# Patient Record
Sex: Female | Born: 1950 | Race: White | Hispanic: No | Smoking: Never smoker
Health system: Southern US, Community
[De-identification: ages and names within clinical notes are randomized; demographics above are authoritative.]

## PROBLEM LIST (undated history)

## (undated) DIAGNOSIS — K219 Gastro-esophageal reflux disease without esophagitis: Secondary | ICD-10-CM

## (undated) DIAGNOSIS — N059 Unspecified nephritic syndrome with unspecified morphologic changes: Secondary | ICD-10-CM

## (undated) DIAGNOSIS — M1990 Unspecified osteoarthritis, unspecified site: Secondary | ICD-10-CM

## (undated) DIAGNOSIS — Z8619 Personal history of other infectious and parasitic diseases: Secondary | ICD-10-CM

## (undated) DIAGNOSIS — T7840XA Allergy, unspecified, initial encounter: Secondary | ICD-10-CM

## (undated) DIAGNOSIS — M8580 Other specified disorders of bone density and structure, unspecified site: Secondary | ICD-10-CM

## (undated) DIAGNOSIS — G709 Myoneural disorder, unspecified: Secondary | ICD-10-CM

## (undated) DIAGNOSIS — S3992XA Unspecified injury of lower back, initial encounter: Secondary | ICD-10-CM

## (undated) HISTORY — DX: Other specified disorders of bone density and structure, unspecified site: M85.80

## (undated) HISTORY — DX: Unspecified injury of lower back, initial encounter: S39.92XA

## (undated) HISTORY — PX: TUBAL LIGATION: SHX77

## (undated) HISTORY — DX: Allergy, unspecified, initial encounter: T78.40XA

## (undated) HISTORY — PX: BLADDER AUGMENTATION: SHX1233

## (undated) HISTORY — PX: CATARACT EXTRACTION: SUR2

## (undated) HISTORY — PX: SPINAL FUSION: SHX223

## (undated) HISTORY — PX: TONSILLECTOMY: SUR1361

## (undated) HISTORY — DX: Gastro-esophageal reflux disease without esophagitis: K21.9

## (undated) HISTORY — PX: COLONOSCOPY: SHX174

## (undated) HISTORY — DX: Personal history of other infectious and parasitic diseases: Z86.19

---
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
## 2003-06-26 ENCOUNTER — Encounter: Admission: RE | Admit: 2003-06-26 | Discharge: 2003-06-26 | Payer: Self-pay | Admitting: Neurosurgery

## 2003-07-19 ENCOUNTER — Encounter: Admission: RE | Admit: 2003-07-19 | Discharge: 2003-07-19 | Payer: Self-pay | Admitting: Neurosurgery

## 2003-08-09 ENCOUNTER — Encounter: Admission: RE | Admit: 2003-08-09 | Discharge: 2003-08-09 | Payer: Self-pay | Admitting: Neurosurgery

## 2004-03-22 HISTORY — PX: ABDOMINAL HYSTERECTOMY: SHX81

## 2004-11-02 ENCOUNTER — Inpatient Hospital Stay (HOSPITAL_COMMUNITY): Admission: RE | Admit: 2004-11-02 | Discharge: 2004-11-04 | Payer: Self-pay | Admitting: Obstetrics and Gynecology

## 2008-03-22 HISTORY — PX: CHOLECYSTECTOMY: SHX55

## 2010-12-15 ENCOUNTER — Other Ambulatory Visit: Payer: Self-pay | Admitting: Neurosurgery

## 2010-12-15 DIAGNOSIS — M47816 Spondylosis without myelopathy or radiculopathy, lumbar region: Secondary | ICD-10-CM

## 2010-12-15 DIAGNOSIS — M5126 Other intervertebral disc displacement, lumbar region: Secondary | ICD-10-CM

## 2010-12-18 ENCOUNTER — Ambulatory Visit
Admission: RE | Admit: 2010-12-18 | Discharge: 2010-12-18 | Disposition: A | Discharge status: Home / Self Care | Payer: 59 | Source: Ambulatory Visit | Attending: Neurosurgery | Admitting: Neurosurgery

## 2010-12-18 DIAGNOSIS — M5126 Other intervertebral disc displacement, lumbar region: Secondary | ICD-10-CM

## 2010-12-18 DIAGNOSIS — M47816 Spondylosis without myelopathy or radiculopathy, lumbar region: Secondary | ICD-10-CM

## 2010-12-18 MED ORDER — IOHEXOL 180 MG/ML  SOLN
1.0000 mL | Freq: Once | INTRAMUSCULAR | Status: AC | PRN
  Administered 2010-12-18: 1 mL via EPIDURAL

## 2010-12-18 MED ORDER — METHYLPREDNISOLONE ACETATE 40 MG/ML INJ SUSP (RADIOLOG
120.0000 mg | Freq: Once | INTRAMUSCULAR | Status: AC
  Administered 2010-12-18: 120 mg via EPIDURAL

## 2012-01-26 ENCOUNTER — Other Ambulatory Visit: Payer: Self-pay | Admitting: Neurosurgery

## 2012-01-28 ENCOUNTER — Telehealth: Payer: Self-pay | Admitting: Vascular Surgery

## 2012-01-28 NOTE — Telephone Encounter (Signed)
Message copied by Margaretmary Eddy on Fri Jan 28, 2012 12:45 PM ------      Message from: Phillips Odor      Created: Wed Jan 26, 2012 11:29 AM      Regarding: appt w/ [facility]       This pt. Needs a new pt. Consultation with [facility] prior to ALIF on 03/07/12/ pls. remind pt. To bring a copy of LS spine films/CD ROM to appt. With her.

## 2012-02-22 ENCOUNTER — Encounter: Payer: Self-pay | Admitting: Vascular Surgery

## 2012-02-22 ENCOUNTER — Other Ambulatory Visit: Payer: Self-pay

## 2012-02-23 ENCOUNTER — Encounter: Payer: Self-pay | Admitting: Vascular Surgery

## 2012-02-23 ENCOUNTER — Ambulatory Visit (INDEPENDENT_AMBULATORY_CARE_PROVIDER_SITE_OTHER): Payer: 59 | Admitting: Vascular Surgery

## 2012-02-23 VITALS — BP 121/46 | HR 75 | Resp 16 | Wt 133.0 lb

## 2012-02-23 DIAGNOSIS — IMO0002 Reserved for concepts with insufficient information to code with codable children: Secondary | ICD-10-CM
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
## 2012-02-25 ENCOUNTER — Encounter (HOSPITAL_COMMUNITY): Payer: Self-pay | Admitting: Pharmacy Technician

## 2012-02-28 NOTE — Pre-Procedure Instructions (Signed)
20 Debra Khan  02/28/2012   Your procedure is scheduled on:  03-07-2012  Report to [facility] at 5:30  AM.  Call this number if you have problems the morning of surgery: [phone]   Remember:   Do not eat food or drink:After Midnight.    .  Take these medicines the morning of surgery with A SIP OF WATER: none   Do not wear jewelry, make-up or nail polish.  Do not wear lotions, powders, or perfumes.  Do not shave 48 hours prior to surgery.   Do not bring valuables to the hospital.  Contacts, dentures or bridgework may not be worn into surgery.  Leave suitcase in the car. After surgery it may be brought to your room.   For patients admitted to the hospital, checkout time is 11:00 AM the day of discharge.   Marland Kitchen    Special Instructions: Shower using CHG 2 nights before surgery and the night before surgery.  If you shower the day of surgery use CHG.  Use special wash - you have one bottle of CHG for all showers.  You should use approximately 1/3 of the bottle for each shower.     Please read over the following fact sheets that you were given: Pain Booklet, Coughing and Deep Breathing, Blood Transfusion Information, MRSA Information and Surgical Site Infection Prevention

## 2012-02-29 ENCOUNTER — Encounter (HOSPITAL_COMMUNITY): Payer: Self-pay

## 2012-02-29 ENCOUNTER — Encounter (HOSPITAL_COMMUNITY)
Admission: RE | Admit: 2012-02-29 | Discharge: 2012-02-29 | Disposition: A | Discharge status: Home / Self Care | Payer: 59 | Source: Ambulatory Visit | Attending: Neurosurgery | Admitting: Neurosurgery

## 2012-02-29 DIAGNOSIS — Z01818 Encounter for other preprocedural examination: Secondary | ICD-10-CM | POA: Insufficient documentation

## 2012-02-29 HISTORY — DX: Myoneural disorder, unspecified: G70.9

## 2012-02-29 HISTORY — DX: Unspecified osteoarthritis, unspecified site: M19.90

## 2012-02-29 LAB — BASIC METABOLIC PANEL
BUN: 15 mg/dL (ref 6–23)
CO2: 30 mEq/L (ref 19–32)
Creatinine, Ser: 0.73 mg/dL (ref 0.50–1.10)
GFR calc non Af Amer: >90 mL/min (ref >90)
Glucose, Bld: 107 mg/dL — ABNORMAL HIGH (ref 70–99)
Potassium: 4.1 mEq/L (ref 3.5–5.1)

## 2012-02-29 LAB — SURGICAL PCR SCREEN: Staphylococcus aureus: POSITIVE — AB

## 2012-02-29 LAB — TYPE AND SCREEN
ABO/RH(D): AB NEG
Antibody Screen: NEGATIVE

## 2012-02-29 LAB — CBC
HCT: 40.5 % (ref 36.0–46.0)
Platelets: 269 10*3/uL (ref 150–400)

## 2012-03-07 ENCOUNTER — Encounter (HOSPITAL_COMMUNITY): Admission: RE | Payer: Self-pay | Source: Ambulatory Visit

## 2012-03-07 ENCOUNTER — Inpatient Hospital Stay (HOSPITAL_COMMUNITY): Admission: RE | Admit: 2012-03-07 | Payer: 59 | Source: Ambulatory Visit | Admitting: Neurosurgery

## 2012-03-07 SURGERY — ANTERIOR LUMBAR FUSION 1 LEVEL
Anesthesia: General

## 2012-05-08 ENCOUNTER — Other Ambulatory Visit: Payer: Self-pay | Admitting: Neurosurgery

## 2012-05-08 ENCOUNTER — Telehealth: Payer: Self-pay | Admitting: Vascular Surgery

## 2012-05-08 NOTE — Telephone Encounter (Addendum)
Message copied by Fredrich Birks on Mon May 08, 2012  4:24 PM ------      Message from: Phillips Odor      Created: Mon May 08, 2012 10:35 AM      Regarding: appt for consult       Pt. Needs appt. W/ [facility] prior to surgery for ALIF on 06/16/12; remind pt. To bring copy of LS spine films to appt. with her. ------  05/08/12: lm for pt to call back and confirm so that I may give instructions for appointment, dpm  05/10/12- patient called to schedule, she is aware of date and time, and states that we already have the CD here from her last visit in 02/2012 with [facility]- surgery was postponed until March, dpm

## 2012-05-29 ENCOUNTER — Encounter: Payer: Self-pay | Admitting: Vascular Surgery

## 2012-05-30 ENCOUNTER — Encounter: Payer: Self-pay | Admitting: Vascular Surgery

## 2012-05-30 ENCOUNTER — Ambulatory Visit (INDEPENDENT_AMBULATORY_CARE_PROVIDER_SITE_OTHER): Payer: 59 | Admitting: Vascular Surgery

## 2012-05-30 VITALS — BP 143/69 | HR 84 | Resp 18 | Ht 64.0 in | Wt 137.0 lb

## 2012-05-30 DIAGNOSIS — IMO0002 Reserved for concepts with insufficient information to code with codable children: Secondary | ICD-10-CM
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
## 2012-06-09 ENCOUNTER — Other Ambulatory Visit (HOSPITAL_COMMUNITY): Payer: 59

## 2012-06-14 ENCOUNTER — Other Ambulatory Visit: Payer: Self-pay

## 2012-06-16 ENCOUNTER — Encounter (HOSPITAL_COMMUNITY): Payer: Self-pay | Admitting: Pharmacy Technician

## 2012-06-20 ENCOUNTER — Encounter (HOSPITAL_COMMUNITY)
Admission: RE | Admit: 2012-06-20 | Discharge: 2012-06-20 | Disposition: A | Discharge status: Home / Self Care | Payer: 59 | Source: Ambulatory Visit | Attending: Neurosurgery | Admitting: Neurosurgery

## 2012-06-20 ENCOUNTER — Encounter (HOSPITAL_COMMUNITY): Payer: Self-pay

## 2012-06-20 HISTORY — DX: Unspecified nephritic syndrome with unspecified morphologic changes: N05.9

## 2012-06-20 LAB — CBC
HCT: 37.9 % (ref 36.0–46.0)
Hemoglobin: 13.7 g/dL (ref 12.0–15.0)
MCH: 31.8 pg (ref 26.0–34.0)
MCHC: 36.1 g/dL — ABNORMAL HIGH (ref 30.0–36.0)

## 2012-06-20 LAB — BASIC METABOLIC PANEL
BUN: 13 mg/dL (ref 6–23)
CO2: 30 mEq/L (ref 19–32)
Chloride: 102 mEq/L (ref 96–112)
GFR calc Af Amer: >90 mL/min (ref >90)
Glucose, Bld: 154 mg/dL — ABNORMAL HIGH (ref 70–99)
Potassium: 3.9 mEq/L (ref 3.5–5.1)

## 2012-06-20 LAB — TYPE AND SCREEN: Antibody Screen: NEGATIVE

## 2012-06-20 NOTE — Pre-Procedure Instructions (Signed)
Debra Khan  06/20/2012   Your procedure is scheduled on:  April 10  Report to [facility] at 05:30 AM.  Call this number if you have problems the morning of surgery: [phone]   Remember:   Do not eat food or drink liquids after midnight.   Take these medicines the morning of surgery with A SIP OF WATER: None   STOP Emergen- C after April 3  Do not wear jewelry, make-up or nail polish.  Do not wear lotions, powders, or perfumes. You may wear deodorant.  Do not shave 48 hours prior to surgery. Men may shave face and neck.  Do not bring valuables to the hospital.  Contacts, dentures or bridgework may not be worn into surgery.  Leave suitcase in the car. After surgery it may be brought to your room.  For patients admitted to the hospital, checkout time is 11:00 AM the day of  discharge.   Special Instructions: Shower using CHG 2 nights before surgery and the night before surgery.  If you shower the day of surgery use CHG.  Use special wash - you have one bottle of CHG for all showers.  You should use approximately 1/3 of the bottle for each shower.   Please read over the following fact sheets that you were given: Pain Booklet, Coughing and Deep Breathing, Blood Transfusion Information and Surgical Site Infection Prevention

## 2012-06-28 MED ORDER — CEFAZOLIN SODIUM-DEXTROSE 2-3 GM-% IV SOLR
2.0000 g | INTRAVENOUS | Status: AC
  Administered 2012-06-29: 2 g via INTRAVENOUS
  Filled 2012-06-28: qty 50
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
## 2012-06-29 ENCOUNTER — Inpatient Hospital Stay (HOSPITAL_COMMUNITY)
Admission: RE | Admit: 2012-06-29 | Discharge: 2012-07-01 | DRG: 460 | Disposition: A | Discharge status: Home Health | Payer: 59 | Source: Ambulatory Visit | Attending: Neurosurgery | Admitting: Neurosurgery

## 2012-06-29 ENCOUNTER — Encounter (HOSPITAL_COMMUNITY): Payer: Self-pay | Admitting: Anesthesiology

## 2012-06-29 ENCOUNTER — Encounter (HOSPITAL_COMMUNITY): Payer: Self-pay | Admitting: *Deleted

## 2012-06-29 ENCOUNTER — Ambulatory Visit (HOSPITAL_COMMUNITY): Payer: 59

## 2012-06-29 ENCOUNTER — Encounter (HOSPITAL_COMMUNITY)
Admission: RE | Disposition: A | Discharge status: Home Health | Payer: Self-pay | Source: Ambulatory Visit | Attending: Neurosurgery

## 2012-06-29 ENCOUNTER — Ambulatory Visit (HOSPITAL_COMMUNITY): Payer: 59 | Admitting: Anesthesiology

## 2012-06-29 DIAGNOSIS — M5137 Other intervertebral disc degeneration, lumbosacral region: Secondary | ICD-10-CM | POA: Diagnosis present

## 2012-06-29 DIAGNOSIS — Z01812 Encounter for preprocedural laboratory examination: Secondary | ICD-10-CM

## 2012-06-29 DIAGNOSIS — M47817 Spondylosis without myelopathy or radiculopathy, lumbosacral region: Principal | ICD-10-CM | POA: Diagnosis present

## 2012-06-29 DIAGNOSIS — M549 Dorsalgia, unspecified: Secondary | ICD-10-CM

## 2012-06-29 DIAGNOSIS — M5126 Other intervertebral disc displacement, lumbar region: Secondary | ICD-10-CM | POA: Diagnosis present

## 2012-06-29 DIAGNOSIS — M51379 Other intervertebral disc degeneration, lumbosacral region without mention of lumbar back pain or lower extremity pain: Secondary | ICD-10-CM | POA: Diagnosis present

## 2012-06-29 HISTORY — PX: ANTERIOR LUMBAR FUSION: SHX1170

## 2012-06-29 HISTORY — PX: ABDOMINAL EXPOSURE: SHX5708

## 2012-06-29 LAB — CBC
HCT: 34.8 % — ABNORMAL LOW (ref 36.0–46.0)
Hemoglobin: 12.5 g/dL (ref 12.0–15.0)
MCV: 87.7 fL (ref 78.0–100.0)
RDW: 12.9 % (ref 11.5–15.5)
WBC: 12.6 10*3/uL — ABNORMAL HIGH (ref 4.0–10.5)

## 2012-06-29 LAB — CREATININE, SERUM
GFR calc Af Amer: >90 mL/min (ref >90)
GFR calc non Af Amer: >90 mL/min (ref >90)

## 2012-06-29 SURGERY — ANTERIOR LUMBAR FUSION 1 LEVEL
Anesthesia: General | Site: Spine Lumbar | Wound class: Clean

## 2012-06-29 MED ORDER — ALUM & MAG HYDROXIDE-SIMETH 200-200-20 MG/5ML PO SUSP: 30.0000 mL | Freq: Four times a day (QID) | ORAL | Status: DC | PRN

## 2012-06-29 MED ORDER — DIAZEPAM 5 MG PO TABS
5.0000 mg | ORAL_TABLET | Freq: Four times a day (QID) | ORAL | Status: DC | PRN
  Administered 2012-07-01: 5 mg via ORAL
  Filled 2012-06-29: qty 1

## 2012-06-29 MED ORDER — HYDROMORPHONE HCL PF 1 MG/ML IJ SOLN
INTRAMUSCULAR | Status: AC
  Filled 2012-06-29: qty 1

## 2012-06-29 MED ORDER — KCL IN DEXTROSE-NACL 20-5-0.45 MEQ/L-%-% IV SOLN
INTRAVENOUS | Status: DC
  Administered 2012-06-29 – 2012-06-30 (×2): via INTRAVENOUS
  Filled 2012-06-29 (×5): qty 1000

## 2012-06-29 MED ORDER — HYDROMORPHONE HCL PF 1 MG/ML IJ SOLN
0.2500 mg | INTRAMUSCULAR | Status: DC | PRN
  Administered 2012-06-29 (×2): 0.5 mg via INTRAVENOUS

## 2012-06-29 MED ORDER — HEMOSTATIC AGENTS (NO CHARGE) OPTIME
TOPICAL | Status: DC | PRN
  Administered 2012-06-29: 1 via TOPICAL

## 2012-06-29 MED ORDER — CEFAZOLIN SODIUM 1-5 GM-% IV SOLN
1.0000 g | Freq: Three times a day (TID) | INTRAVENOUS | Status: AC
  Administered 2012-06-29 (×2): 1 g via INTRAVENOUS
  Filled 2012-06-29 (×2): qty 50

## 2012-06-29 MED ORDER — GLYCOPYRROLATE 0.2 MG/ML IJ SOLN
INTRAMUSCULAR | Status: DC | PRN
  Administered 2012-06-29: 0.6 mg via INTRAVENOUS

## 2012-06-29 MED ORDER — ONDANSETRON HCL 4 MG/2ML IJ SOLN: 4.0000 mg | Freq: Four times a day (QID) | INTRAMUSCULAR | Status: DC | PRN

## 2012-06-29 MED ORDER — MORPHINE SULFATE (PF) 1 MG/ML IV SOLN
INTRAVENOUS | Status: DC
  Administered 2012-06-29: 12:00:00 via INTRAVENOUS
  Administered 2012-06-29: 9 mg via INTRAVENOUS
  Administered 2012-06-29: 4.5 mg via INTRAVENOUS
  Administered 2012-06-30: 06:00:00 via INTRAVENOUS
  Administered 2012-06-30: 4.39 mg via INTRAVENOUS
  Filled 2012-06-29: qty 25

## 2012-06-29 MED ORDER — SODIUM CHLORIDE 0.9 % IJ SOLN: 9.0000 mL | INTRAMUSCULAR | Status: DC | PRN

## 2012-06-29 MED ORDER — ONDANSETRON HCL 4 MG/2ML IJ SOLN
4.0000 mg | Freq: Once | INTRAMUSCULAR | Status: AC | PRN
  Administered 2012-06-29: 4 mg via INTRAVENOUS

## 2012-06-29 MED ORDER — ONDANSETRON HCL 4 MG/2ML IJ SOLN
INTRAMUSCULAR | Status: DC | PRN
  Administered 2012-06-29: 4 mg via INTRAVENOUS

## 2012-06-29 MED ORDER — ENOXAPARIN SODIUM 30 MG/0.3ML ~~LOC~~ SOLN
30.0000 mg | SUBCUTANEOUS | Status: DC
  Administered 2012-06-30 – 2012-07-01 (×2): 30 mg via SUBCUTANEOUS
  Filled 2012-06-29 (×3): qty 0.3

## 2012-06-29 MED ORDER — DIPHENHYDRAMINE HCL 50 MG/ML IJ SOLN: 12.5000 mg | Freq: Four times a day (QID) | INTRAMUSCULAR | Status: DC | PRN

## 2012-06-29 MED ORDER — DEXAMETHASONE SODIUM PHOSPHATE 4 MG/ML IJ SOLN
INTRAMUSCULAR | Status: DC | PRN
  Administered 2012-06-29: 4 mg via INTRAVENOUS

## 2012-06-29 MED ORDER — NALOXONE HCL 0.4 MG/ML IJ SOLN: 0.4000 mg | INTRAMUSCULAR | Status: DC | PRN

## 2012-06-29 MED ORDER — ACETAMINOPHEN 650 MG RE SUPP: 650.0000 mg | RECTAL | Status: DC | PRN

## 2012-06-29 MED ORDER — ACETAMINOPHEN 325 MG PO TABS: 650.0000 mg | ORAL_TABLET | ORAL | Status: DC | PRN

## 2012-06-29 MED ORDER — HYDROCODONE-ACETAMINOPHEN 5-325 MG PO TABS
1.0000 | ORAL_TABLET | ORAL | Status: DC | PRN
  Administered 2012-06-30 – 2012-07-01 (×4): 2 via ORAL
  Filled 2012-06-29 (×4): qty 2

## 2012-06-29 MED ORDER — FLEET ENEMA 7-19 GM/118ML RE ENEM: 1.0000 | ENEMA | Freq: Once | RECTAL | Status: AC | PRN

## 2012-06-29 MED ORDER — 0.9 % SODIUM CHLORIDE (POUR BTL) OPTIME
TOPICAL | Status: DC | PRN
  Administered 2012-06-29: 1000 mL

## 2012-06-29 MED ORDER — SODIUM CHLORIDE 0.9 % IJ SOLN: 3.0000 mL | INTRAMUSCULAR | Status: DC | PRN

## 2012-06-29 MED ORDER — DIPHENHYDRAMINE HCL 12.5 MG/5ML PO ELIX: 12.5000 mg | ORAL_SOLUTION | Freq: Four times a day (QID) | ORAL | Status: DC | PRN

## 2012-06-29 MED ORDER — NEOSTIGMINE METHYLSULFATE 1 MG/ML IJ SOLN
INTRAMUSCULAR | Status: DC | PRN
  Administered 2012-06-29: 5 mg via INTRAVENOUS

## 2012-06-29 MED ORDER — PHENOL 1.4 % MT LIQD: 1.0000 | OROMUCOSAL | Status: DC | PRN

## 2012-06-29 MED ORDER — BISACODYL 10 MG RE SUPP: 10.0000 mg | Freq: Every day | RECTAL | Status: DC | PRN

## 2012-06-29 MED ORDER — EPHEDRINE SULFATE 50 MG/ML IJ SOLN
INTRAMUSCULAR | Status: DC | PRN
  Administered 2012-06-29 (×2): 5 mg via INTRAVENOUS

## 2012-06-29 MED ORDER — DOCUSATE SODIUM 100 MG PO CAPS
100.0000 mg | ORAL_CAPSULE | Freq: Two times a day (BID) | ORAL | Status: DC
  Administered 2012-06-29 – 2012-06-30 (×4): 100 mg via ORAL
  Filled 2012-06-29 (×4): qty 1

## 2012-06-29 MED ORDER — MIDAZOLAM HCL 5 MG/5ML IJ SOLN
INTRAMUSCULAR | Status: DC | PRN
  Administered 2012-06-29: 2 mg via INTRAVENOUS

## 2012-06-29 MED ORDER — LACTATED RINGERS IV SOLN
INTRAVENOUS | Status: DC | PRN
  Administered 2012-06-29 (×3): via INTRAVENOUS

## 2012-06-29 MED ORDER — LIDOCAINE HCL 4 % MT SOLN
OROMUCOSAL | Status: DC | PRN
  Administered 2012-06-29: 4 mL via TOPICAL

## 2012-06-29 MED ORDER — SENNA 8.6 MG PO TABS
1.0000 | ORAL_TABLET | Freq: Two times a day (BID) | ORAL | Status: DC
  Administered 2012-06-30 (×2): 8.6 mg via ORAL
  Filled 2012-06-29 (×4): qty 1

## 2012-06-29 MED ORDER — THROMBIN 5000 UNITS EX SOLR
CUTANEOUS | Status: DC | PRN
  Administered 2012-06-29 (×2): 5000 [IU] via TOPICAL

## 2012-06-29 MED ORDER — ONDANSETRON HCL 4 MG/2ML IJ SOLN: 4.0000 mg | INTRAMUSCULAR | Status: DC | PRN

## 2012-06-29 MED ORDER — OXYCODONE-ACETAMINOPHEN 5-325 MG PO TABS: 1.0000 | ORAL_TABLET | ORAL | Status: DC | PRN

## 2012-06-29 MED ORDER — ROCURONIUM BROMIDE 100 MG/10ML IV SOLN
INTRAVENOUS | Status: DC | PRN
  Administered 2012-06-29: 50 mg via INTRAVENOUS

## 2012-06-29 MED ORDER — LIDOCAINE HCL (CARDIAC) 20 MG/ML IV SOLN
INTRAVENOUS | Status: DC | PRN
  Administered 2012-06-29: 60 mg via INTRAVENOUS

## 2012-06-29 MED ORDER — MENTHOL 3 MG MT LOZG: 1.0000 | LOZENGE | OROMUCOSAL | Status: DC | PRN

## 2012-06-29 MED ORDER — MORPHINE SULFATE (PF) 1 MG/ML IV SOLN
INTRAVENOUS | Status: AC
  Filled 2012-06-29: qty 25

## 2012-06-29 MED ORDER — PANTOPRAZOLE SODIUM 40 MG IV SOLR
40.0000 mg | Freq: Every day | INTRAVENOUS | Status: DC
  Administered 2012-06-29: 40 mg via INTRAVENOUS
  Filled 2012-06-29 (×2): qty 40

## 2012-06-29 MED ORDER — SUFENTANIL CITRATE 50 MCG/ML IV SOLN
INTRAVENOUS | Status: DC | PRN
  Administered 2012-06-29: 20 ug via INTRAVENOUS
  Administered 2012-06-29: 10 ug via INTRAVENOUS

## 2012-06-29 MED ORDER — PROPOFOL 10 MG/ML IV BOLUS
INTRAVENOUS | Status: DC | PRN
  Administered 2012-06-29: 200 mg via INTRAVENOUS

## 2012-06-29 MED ORDER — ONDANSETRON HCL 4 MG/2ML IJ SOLN
INTRAMUSCULAR | Status: AC
  Filled 2012-06-29: qty 2

## 2012-06-29 MED ORDER — LACTATED RINGERS IV SOLN
INTRAVENOUS | Status: DC | PRN
  Administered 2012-06-29: 08:00:00 via INTRAVENOUS

## 2012-06-29 MED ORDER — SODIUM CHLORIDE 0.9 % IV SOLN
250.0000 mL | INTRAVENOUS | Status: DC
Start: 2012-06-29 — End: 2012-07-01

## 2012-06-29 MED ORDER — VECURONIUM BROMIDE 10 MG IV SOLR
INTRAVENOUS | Status: DC | PRN
  Administered 2012-06-29: 2 mg via INTRAVENOUS
  Administered 2012-06-29: 3 mg via INTRAVENOUS
  Administered 2012-06-29: 2 mg via INTRAVENOUS
  Administered 2012-06-29: 3 mg via INTRAVENOUS

## 2012-06-29 MED ORDER — SODIUM CHLORIDE 0.9 % IJ SOLN
3.0000 mL | Freq: Two times a day (BID) | INTRAMUSCULAR | Status: DC
  Administered 2012-06-29 – 2012-06-30 (×4): 3 mL via INTRAVENOUS

## 2012-06-29 MED ORDER — SENNOSIDES-DOCUSATE SODIUM 8.6-50 MG PO TABS
1.0000 | ORAL_TABLET | Freq: Every evening | ORAL | Status: DC | PRN
  Administered 2012-06-29: 1 via ORAL
  Filled 2012-06-29: qty 1

## 2012-06-29 SURGICAL SUPPLY — 101 items
ADH SKN CLS APL DERMABOND .7 (GAUZE/BANDAGES/DRESSINGS)
ADH SKN CLS LQ APL DERMABOND (GAUZE/BANDAGES/DRESSINGS) ×2
APPLIER CLIP 11 MED OPEN (CLIP) ×3
APR CLP MED 11 20 MLT OPN (CLIP) ×2
BONE VOID FILLER STRIP 10CC (Bone Implant) ×1 IMPLANT
BUR BARREL STRAIGHT FLUTE 4.0 (BURR) IMPLANT
CAGE ALIF MED 12X12MM LUMBAR (Cage) ×1 IMPLANT
CANISTER SUCTION 2500CC (MISCELLANEOUS) ×3 IMPLANT
CLIP APPLIE 11 MED OPEN (CLIP) ×4 IMPLANT
CLOTH BEACON ORANGE TIMEOUT ST (SAFETY) ×5 IMPLANT
CONT SPEC 4OZ CLIKSEAL STRL BL (MISCELLANEOUS) ×3 IMPLANT
COVER BACK TABLE 24X17X13 BIG (DRAPES) IMPLANT
COVER TABLE BACK 60X90 (DRAPES) ×3 IMPLANT
DERMABOND ADHESIVE PROPEN (GAUZE/BANDAGES/DRESSINGS) ×1
DERMABOND ADVANCED (GAUZE/BANDAGES/DRESSINGS)
DERMABOND ADVANCED .7 DNX12 (GAUZE/BANDAGES/DRESSINGS) ×4 IMPLANT
DERMABOND ADVANCED .7 DNX6 (GAUZE/BANDAGES/DRESSINGS) IMPLANT
DRAPE C-ARM 42X72 X-RAY (DRAPES) ×6 IMPLANT
DRAPE INCISE IOBAN 66X45 STRL (DRAPES) ×2 IMPLANT
DRAPE LAPAROTOMY 100X72X124 (DRAPES) ×3 IMPLANT
DRAPE POUCH INSTRU U-SHP 10X18 (DRAPES) ×3 IMPLANT
DRESSING TELFA 8X3 (GAUZE/BANDAGES/DRESSINGS) IMPLANT
DURAPREP 26ML APPLICATOR (WOUND CARE) ×2 IMPLANT
ELECT BLADE 4.0 EZ CLEAN MEGAD (MISCELLANEOUS) ×3
ELECT REM PT RETURN 9FT ADLT (ELECTROSURGICAL) ×3
ELECTRODE BLDE 4.0 EZ CLN MEGD (MISCELLANEOUS) ×2 IMPLANT
ELECTRODE REM PT RTRN 9FT ADLT (ELECTROSURGICAL) ×2 IMPLANT
FELT TEFLON 6X6 (MISCELLANEOUS) IMPLANT
GAUZE SPONGE 4X4 16PLY XRAY LF (GAUZE/BANDAGES/DRESSINGS) IMPLANT
GLOVE BIO SURGEON STRL SZ8 (GLOVE) ×3 IMPLANT
GLOVE BIOGEL M 8.0 STRL (GLOVE) ×1 IMPLANT
GLOVE BIOGEL PI IND STRL 7.5 (GLOVE) ×2 IMPLANT
GLOVE BIOGEL PI IND STRL 8 (GLOVE) IMPLANT
GLOVE BIOGEL PI IND STRL 8.5 (GLOVE) ×2 IMPLANT
GLOVE BIOGEL PI INDICATOR 7.5 (GLOVE) ×1
GLOVE BIOGEL PI INDICATOR 8 (GLOVE) ×3
GLOVE BIOGEL PI INDICATOR 8.5 (GLOVE) ×3
GLOVE ECLIPSE 7.5 STRL STRAW (GLOVE) ×6 IMPLANT
GLOVE EXAM NITRILE LRG STRL (GLOVE) IMPLANT
GLOVE EXAM NITRILE MD LF STRL (GLOVE) ×1 IMPLANT
GLOVE EXAM NITRILE XL STR (GLOVE) IMPLANT
GLOVE EXAM NITRILE XS STR PU (GLOVE) IMPLANT
GOWN BRE IMP SLV AUR LG STRL (GOWN DISPOSABLE) ×2 IMPLANT
GOWN BRE IMP SLV AUR XL STRL (GOWN DISPOSABLE) ×1 IMPLANT
GOWN STRL NON-REIN LRG LVL3 (GOWN DISPOSABLE) ×2 IMPLANT
GOWN STRL REIN 2XL LVL4 (GOWN DISPOSABLE) ×3 IMPLANT
INSERT FOGARTY 61MM (MISCELLANEOUS) IMPLANT
INSERT FOGARTY SM (MISCELLANEOUS) IMPLANT
KIT BASIN OR (CUSTOM PROCEDURE TRAY) ×3 IMPLANT
KIT INFUSE SMALL (Orthopedic Implant) ×1 IMPLANT
KIT ROOM TURNOVER OR (KITS) ×5 IMPLANT
LOOP VESSEL MAXI BLUE (MISCELLANEOUS) ×2 IMPLANT
LOOP VESSEL MINI RED (MISCELLANEOUS) ×2 IMPLANT
NDL HYPO 25X1 1.5 SAFETY (NEEDLE) IMPLANT
NDL SPNL 18GX3.5 QUINCKE PK (NEEDLE) ×2 IMPLANT
NEEDLE BONE MARROW 8GAX6 (NEEDLE) ×1 IMPLANT
NEEDLE HYPO 25X1 1.5 SAFETY (NEEDLE) IMPLANT
NEEDLE SPNL 18GX3.5 QUINCKE PK (NEEDLE) ×3 IMPLANT
NS IRRIG 1000ML POUR BTL (IV SOLUTION) ×3 IMPLANT
PACK LAMINECTOMY NEURO (CUSTOM PROCEDURE TRAY) ×3 IMPLANT
PAD ARMBOARD 7.5X6 YLW CONV (MISCELLANEOUS) ×7 IMPLANT
PLATE ALIF SPINAL 10MM (Plate) ×1 IMPLANT
SCREW 25MM (Screw) ×6 IMPLANT
SCREW 30MM (Screw) ×2 IMPLANT
SCREW BN 25X6XNS SPNE ALIF (Screw) IMPLANT
SPONGE GAUZE 4X4 12PLY (GAUZE/BANDAGES/DRESSINGS) IMPLANT
SPONGE INTESTINAL PEANUT (DISPOSABLE) ×13 IMPLANT
SPONGE LAP 18X18 X RAY DECT (DISPOSABLE) ×3 IMPLANT
SPONGE LAP 4X18 X RAY DECT (DISPOSABLE) IMPLANT
SPONGE SURGIFOAM ABS GEL SZ50 (HEMOSTASIS) ×1 IMPLANT
STAPLER VISISTAT 35W (STAPLE) ×1 IMPLANT
SUT PROLENE 4 0 RB 1 (SUTURE)
SUT PROLENE 4-0 RB1 .5 CRCL 36 (SUTURE) ×8 IMPLANT
SUT PROLENE 5 0 CC1 (SUTURE) IMPLANT
SUT PROLENE 6 0 C 1 30 (SUTURE) ×2 IMPLANT
SUT PROLENE 6 0 CC (SUTURE) IMPLANT
SUT SILK 0 TIES 10X30 (SUTURE) ×2 IMPLANT
SUT SILK 2 0 TIES 10X30 (SUTURE) ×2 IMPLANT
SUT SILK 2 0 TIES 17X18 (SUTURE) ×3
SUT SILK 2 0SH CR/8 30 (SUTURE) IMPLANT
SUT SILK 2-0 18XBRD TIE BLK (SUTURE) ×2 IMPLANT
SUT SILK 3 0 TIES 10X30 (SUTURE) ×3 IMPLANT
SUT SILK 3 0SH CR/8 30 (SUTURE) IMPLANT
SUT VIC AB 0 CT1 27 (SUTURE) ×3
SUT VIC AB 0 CT1 27XBRD ANBCTR (SUTURE) ×6 IMPLANT
SUT VIC AB 1 CT1 18XBRD ANBCTR (SUTURE) ×2 IMPLANT
SUT VIC AB 1 CT1 8-18 (SUTURE)
SUT VIC AB 2-0 CT1 18 (SUTURE) ×3 IMPLANT
SUT VIC AB 2-0 CT1 27 (SUTURE)
SUT VIC AB 2-0 CT1 27XBRD (SUTURE) ×2 IMPLANT
SUT VIC AB 2-0 CTB1 (SUTURE) ×2 IMPLANT
SUT VIC AB 3-0 SH 27 (SUTURE)
SUT VIC AB 3-0 SH 27X BRD (SUTURE) ×2 IMPLANT
SUT VIC AB 3-0 SH 8-18 (SUTURE) ×4 IMPLANT
SUT VICRYL 4-0 PS2 18IN ABS (SUTURE) IMPLANT
SYR 20ML ECCENTRIC (SYRINGE) ×3 IMPLANT
TOWEL OR 17X24 6PK STRL BLUE (TOWEL DISPOSABLE) ×5 IMPLANT
TOWEL OR 17X26 10 PK STRL BLUE (TOWEL DISPOSABLE) ×5 IMPLANT
TRAP SPECIMEN MUCOUS 40CC (MISCELLANEOUS) ×3 IMPLANT
TRAY FOLEY CATH 14FRSI W/METER (CATHETERS) ×3 IMPLANT
WATER STERILE IRR 1000ML POUR (IV SOLUTION) ×3 IMPLANT

## 2012-06-29 NOTE — Anesthesia Postprocedure Evaluation (Signed)
  Anesthesia Post-op Note  Patient: Debra Khan  Procedure(s) Performed: Procedure(s) with comments: ANTERIOR LUMBAR FUSION 1 LEVEL (N/A) - Lumbar five-Sacral one Anterior lumbar interbody fusion with Dr. Edilia Bo for approach ABDOMINAL EXPOSURE (N/A)  Patient Location: PACU  Anesthesia Type:General  Level of Consciousness: awake, oriented and patient cooperative  Airway and Oxygen Therapy: Patient Spontanous Breathing  Post-op Pain: mild  Post-op Assessment: Post-op Vital signs reviewed, Patient's Cardiovascular Status Stable, Respiratory Function Stable, Patent Airway, No signs of Nausea or vomiting and Pain level controlled  Post-op Vital Signs: stable  Complications: No apparent anesthesia complications

## 2012-06-29 NOTE — Anesthesia Preprocedure Evaluation (Addendum)
Anesthesia Evaluation  Patient identified by MRN, date of birth, ID band Patient awake    Reviewed: Allergy & Precautions, H&P , NPO status , Patient's Chart, lab work & pertinent test results  History of Anesthesia Complications Negative for: history of anesthetic complications  Airway Mallampati: II TM Distance: >3 FB Neck ROM: full    Dental  (+) Teeth Intact and Dental Advisory Given   Pulmonary neg pulmonary ROS,          Cardiovascular negative cardio ROS  Rhythm:regular Rate:Normal     Neuro/Psych  Neuromuscular disease    GI/Hepatic negative GI ROS, Neg liver ROS,   Endo/Other  negative endocrine ROS  Renal/GU 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     Musculoskeletal   Abdominal   Peds  Hematology negative hematology ROS (+)   Anesthesia Other Findings   Reproductive/Obstetrics negative OB ROS                          Anesthesia Physical Anesthesia Plan  ASA: I  Anesthesia Plan: General   Post-op Pain Management:    Induction: Intravenous  Airway Management Planned: Oral ETT  Additional Equipment:   Intra-op Plan:   Post-operative Plan: Extubation in OR  Informed Consent: I have reviewed the patients History and Physical, chart, labs and discussed the procedure including the risks, benefits and alternatives for the proposed anesthesia with the patient or authorized representative who has indicated his/her understanding and acceptance.     Plan Discussed with: CRNA, Anesthesiologist and Surgeon  Anesthesia Plan Comments:         Anesthesia Quick Evaluation

## 2012-06-29 NOTE — Brief Op Note (Signed)
06/29/2012  11:06 AM  PATIENT:  Debra Khan  62 y.o. 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 Dickson for approach ABDOMINAL EXPOSURE (N/A) With anterior lumbar plate SURGEON:  Surgeon(s) and Role: Panel 1:    * Naziyah Tieszen, MD - Primary    * Ernesto M Botero, MD  Panel 2:    * Christopher S Dickson, MD - Primary  PHYSICIAN ASSISTANT:   ASSISTANTS: Poteat, RN   ANESTHESIA:   general  EBL:  Total I/O In: 2250 [I.V.:2250] Out: 250 [Urine:200; Blood:50]  BLOOD ADMINISTERED:none  DRAINS: none   LOCAL MEDICATIONS USED:  MARCAINE     SPECIMEN:  No Specimen  DISPOSITION OF SPECIMEN:  N/A  COUNTS:  YES  TOURNIQUET:  * No tourniquets in log *  DICTATION: INDICATIONS:  Pateint is 62 year old female with chronic and intractable back and bilateral lower extremity pain with marked disc degeneration and spondylosis at the L 5 S 1 level.  She has failed to improve despite extensive and prolonged conservative therapy.  It was elected to take her to surgery for anterior lumbar decompression and fusion at the L 5 S1 level.  PROCEDURE:  Doctor Dickson performed exposure and his portion of the procedure will be dictated separately.  Upon exposing the L 5 S1 level, a localizing X ray was obtained with the C arm.  I then incised the anterior annulus and performed a thorough discectomy.  The endplates were cleared of disc and cartilagenous material and a thorough discectomy was performed with decompression of the ventral annulus and disc material.  After trial, a 12 degree  lordotic Solus Alphatec PEEK spacer with titanium finswas selected, packed with small BMP and NexOss which was reconstituted with marrow rich blood aspirated from the S 1 vertebral body.  The implant was tamped into position and positioning was confirmed with C arm. The fins were deployed without difficulty.  A 10 mm lumbosacral plate was then affixed to the L 5 and S1 vertebrae using 25 and 30 x 6.0 mm screws.  Locking mechanisms were engaged, soft tissues were inspected and found to be in good repair.  Retractors were removed.  Fascia was closed with 0 vicryl running stitch, skin edges closed with 2-0 and 3-0 vicryl sutures.  Wound was dressed with a sterile occlusive dressing.  Patient was extubated in the OR and taken to recovery having tolerated her surgery well.  Counts were correct. Final AP radiograph showed no retained foreign bodies.   PLAN OF CARE: Admit to inpatient   PATIENT DISPOSITION:  PACU - hemodynamically stable.   Delay start of Pharmacological VTE agent (>24hrs) due to surgical blood loss or risk of bleeding: yes  

## 2012-06-29 NOTE — Clinical Social Work Note (Signed)
Clinical Social Work   CSW received consult for SNF. CSW reviewed chart. Per chart review, pt had surgery today. Awaiting PT/OT evals for discharge recommendations. PT/OT evals are needed for prior auth for SNF placement. CSW will assess pt for SNF, if appropriate. CSW will continue to follow.   Dede Query, MSW, LCSW [phone]

## 2012-06-29 NOTE — Anesthesia Procedure Notes (Signed)
Procedure Name: Intubation Date/Time: 06/29/2012 7:36 AM Performed by: Lovie Chol Pre-anesthesia Checklist: Patient identified, Emergency Drugs available, Suction available, Patient being monitored and Timeout performed Patient Re-evaluated:Patient Re-evaluated prior to inductionOxygen Delivery Method: Circle system utilized Preoxygenation: Pre-oxygenation with 100% oxygen Intubation Type: IV induction Ventilation: Mask ventilation without difficulty Laryngoscope Size: Miller and 2 Grade View: Grade I Tube type: Oral Tube size: 7.5 mm Number of attempts: 1 Airway Equipment and Method: Bougie stylet and LTA kit utilized Placement Confirmation: ETT inserted through vocal cords under direct vision,  positive ETCO2,  CO2 detector and breath sounds checked- equal and bilateral Secured at: 21 cm Tube secured with: Tape Dental Injury: Teeth and Oropharynx as per pre-operative assessment  Comments: Smooth IV induction. EZ mask. DL x 1 Mil 2. Grade 2 view with cricoid. LTA easily passed. Unable to pass ETT through cords. Bougie stylet utilized. ETT slid easily over Bougie. +ETCO2. bbse.

## 2012-06-29 NOTE — Op Note (Signed)
NAME: Debra Khan   MRN: [id number] DOB: 06-Oct-1950    DATE OF OPERATION: 06/29/2012  PREOP DIAGNOSIS: degenerative disc disease L5-S1  POSTOP DIAGNOSIS: same  PROCEDURE: anterior retroperitoneal exposure L5-S1  SURGEON: Di Kindle. Edilia Bo, MD, FACS  ASSIST: Frederik Pear Nurse practitioner  ANESTHESIA: Gen.   EBL: minimal  INDICATIONS: [name] is a 62 y.o. female with degenerative disc disease at L5-S1. We were asked to provide anterior retroperitoneal exposure  FINDINGS: left iliac arteries were soft without significant plaque.  TECHNIQUE: Patient was taken to the operating room and received a general anesthetic. The level of the L5-S1 disc was marked under fluoroscopy. The abdomen was prepped and draped in usual sterile fashion. A transverse incision was made to the left of the midline at the level that had been marked. Skin incision was carried down to the anterior rectus fascia. This was divided transversely. The incision in the fascia on the right extended past the linea alba to the right rectus abdominis muscle. The anterior rectus sheath was mobilized superiorly and inferiorly allowing the left rectus abdominis muscle to be fully mobilized. Initially, this muscle was retracted medially. This allowed entry into the retroperitoneal space. The retroperitoneal dissection was continued medial to the left common and external iliac arteries down to the sacral promontory. The middle sacral vessels were identified dissected free and then doubly clipped and divided. Oozing blunt dissection the left common iliac vein was dissected free from the L5-S1 disc. Once the disc was adequately exposed the [facility] retractor was placed. The reversed with retractor was placed on the right side of the disc and then also on the left side the disc allowing adequate exposure of L5-S1 disc. The level of the exposure was confirmed under fluoroscopy.  The remainder of the dictation is as per Dr. Kerri Perches.  Waverly Ferrari, MD, FACS [facility]  DATE OF DICTATION:   06/29/2012

## 2012-06-29 NOTE — Interval H&P Note (Signed)
History and Physical Interval Note:  06/29/2012 7:26 AM  Debra Khan  has presented today for surgery, with the diagnosis of Lumbar spondylosis, Lumbar degenerative disc disease, Lumbar radiculopathy, Lumbar hnp without myelopathy  The various methods of treatment have been discussed with the patient and family. After consideration of risks, benefits and other options for treatment, the patient has consented to  Procedure(s) with comments: ANTERIOR LUMBAR FUSION 1 LEVEL (N/A) - L5-S1 Anterior lumbar interbody fusion with Dr. Arbie Cookey for approach ABDOMINAL EXPOSURE (N/A) as a surgical intervention .  [name] history has been reviewed, patient examined, no change in status, stable for surgery.  I have reviewed the patient's chart and labs.  Questions were answered to the patient's satisfaction.     Verbena Boeding D
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
## 2012-06-29 NOTE — Progress Notes (Signed)
Awake, alert, conversant.  Full strength both legs.  No numbness.  Doing well.   

## 2012-06-29 NOTE — Progress Notes (Signed)
Pt today admission received from PACU  S/P L5 -S1  AL IF. Pt wake a  Awake ,alert and oriented x4 moves all  extremeties  Slight numbness and tingling to rt  Toes.  PAC morphine in use pain under control per pt . assessment done. Incision site dry clean and intact. Pt made comfortable in bed needed care rendered oriented to room  call bell within reach to call for assist. Rn will continue to monitor Debra Roup Rn .

## 2012-06-29 NOTE — Preoperative (Signed)
Beta Blockers   Reason not to administer Beta Blockers:Not Applicable 

## 2012-06-29 NOTE — Op Note (Signed)
06/29/2012  11:06 AM  PATIENT:  Debra Khan  62 y.o. 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 Edilia Bo for approach ABDOMINAL EXPOSURE (N/A) With anterior lumbar plate SURGEON:  Surgeon(s) and Role: Panel 1:    * Maeola Harman, MD - Primary    * Karn Cassis, MD  Panel 2:    * Chuck Hint, MD - Primary  PHYSICIAN ASSISTANT:   ASSISTANTS: Poteat, RN   ANESTHESIA:   general  EBL:  Total I/O In: 2250 [I.V.:2250] Out: 250 [Urine:200; Blood:50]  BLOOD ADMINISTERED:none  DRAINS: none   LOCAL MEDICATIONS USED:  MARCAINE     SPECIMEN:  No Specimen  DISPOSITION OF SPECIMEN:  N/A  COUNTS:  YES  TOURNIQUET:  * No tourniquets in log *  DICTATION: INDICATIONS:  Pateint is 62 year old female with chronic and intractable back and bilateral lower extremity pain with marked disc degeneration and spondylosis at the L 5 S 1 level.  She has failed to improve despite extensive and prolonged conservative therapy.  It was elected to take her to surgery for anterior lumbar decompression and fusion at the L 5 S1 level.  PROCEDURE:  Doctor Edilia Bo performed exposure and his portion of the procedure will be dictated separately.  Upon exposing the L 5 S1 level, a localizing X ray was obtained with the C arm.  I then incised the anterior annulus and performed a thorough discectomy.  The endplates were cleared of disc and cartilagenous material and a thorough discectomy was performed with decompression of the ventral annulus and disc material.  After trial, a 12 degree  lordotic Solus Alphatec PEEK spacer with titanium finswas selected, packed with small BMP and NexOss which was reconstituted with marrow rich blood aspirated from the S 1 vertebral body.  The implant was tamped into position and positioning was confirmed with C arm. The fins were deployed without difficulty.  A 10 mm lumbosacral plate was then affixed to the L 5 and S1 vertebrae using 25 and 30 x 6.0 mm screws.  Locking mechanisms were engaged, soft tissues were inspected and found to be in good repair.  Retractors were removed.  Fascia was closed with 0 vicryl running stitch, skin edges closed with 2-0 and 3-0 vicryl sutures.  Wound was dressed with a sterile occlusive dressing.  Patient was extubated in the OR and taken to recovery having tolerated her surgery well.  Counts were correct. Final AP radiograph showed no retained foreign bodies.   PLAN OF CARE: Admit to inpatient   PATIENT DISPOSITION:  PACU - hemodynamically stable.   Delay start of Pharmacological VTE agent (>24hrs) due to surgical blood loss or risk of bleeding: yes

## 2012-06-29 NOTE — Transfer of Care (Signed)
Immediate Anesthesia Transfer of Care Note  Patient: Debra Khan  Procedure(s) Performed: Procedure(s) with comments: ANTERIOR LUMBAR FUSION 1 LEVEL (N/A) - Lumbar five-Sacral one Anterior lumbar interbody fusion with Dr. Edilia Bo for approach ABDOMINAL EXPOSURE (N/A)  Patient Location: PACU  Anesthesia Type:General  Level of Consciousness: oriented, sedated and patient cooperative  Airway & Oxygen Therapy: Patient Spontanous Breathing and Patient connected to face mask oxygen  Post-op Assessment: Report given to PACU RN and Post -op Vital signs reviewed and stable  Post vital signs: Reviewed and stable  Complications: No apparent anesthesia complications

## 2012-06-29 NOTE — Interval H&P Note (Signed)
History and Physical Interval Note:  06/29/2012 6:49 AM  Debra Khan  has presented today for surgery, with the diagnosis of Lumbar spondylosis, Lumbar degenerative disc disease, Lumbar radiculopathy, Lumbar hnp without myelopathy  The various methods of treatment have been discussed with the patient and family. After consideration of risks, benefits and other options for treatment, the patient has consented to  Procedure(s) with comments: ANTERIOR LUMBAR FUSION 1 LEVEL (N/A) - L5-S1 Anterior lumbar interbody fusion with Dr. Arbie Cookey for approach ABDOMINAL EXPOSURE (N/A) as a surgical intervention .  [name] history has been reviewed, patient examined, no change in status, stable for surgery.  I have reviewed the patient's chart and labs.  Questions were answered to the patient's satisfaction.     [name]

## 2012-06-30 ENCOUNTER — Encounter (HOSPITAL_COMMUNITY): Payer: Self-pay | Admitting: Neurosurgery

## 2012-06-30 MED ORDER — PANTOPRAZOLE SODIUM 40 MG PO TBEC
40.0000 mg | DELAYED_RELEASE_TABLET | Freq: Every day | ORAL | Status: DC
  Administered 2012-06-30: 40 mg via ORAL
  Filled 2012-06-30: qty 1

## 2012-06-30 NOTE — Progress Notes (Signed)
Patient ID: Debra Khan, female   DOB: [birth year], 62 y.o.   MRN: [id number]  Sitting in chair. No pain at present. Good strength BLE. Planning for d/c to home in am. Rx's to chart from Dr. Venetia Maxon: Percocet 5/325 1-2 po q4hrs prn pain #50; Valium 5mg  1 po q8hrs prn spasm #40.   Georgiann Cocker, RN, BSN

## 2012-06-30 NOTE — Progress Notes (Signed)
Referral received for SNF. Chart reviewed and CSW has spoken with RNCM who indicates that patient is for DC to home with Home Health and DME. PT recommends Home Health -PT; No further recommendations per OT. CSW to sign off. Please re-consult if CSW needs arise.  Lorri Frederick. [place]  [phone]

## 2012-06-30 NOTE — Plan of Care (Signed)
Problem: Phase II Progression Outcomes Goal: Discharge plan established No follow up OT recommended at this time     

## 2012-06-30 NOTE — Progress Notes (Signed)
Doing well.  D/C home this pm if doing well.

## 2012-06-30 NOTE — Evaluation (Signed)
Occupational Therapy Evaluation Patient Details Name: Debra Khan MRN: [id number] DOB: 1951-02-03 Today's Date: 06/30/2012 Time: 9562-1308 OT Time Calculation (min): 21 min  OT Assessment / Plan / Recommendation Clinical Impression  Pt dong well following L5 - S1 ALIF and requires set up/sup - min guard A with ADLs, Mod I - sup with ADL mobility. All education completed and no further acute care or follow up OT needed at this time. OT will sign off    OT Assessment  Patient does not need any further OT services    Follow Up Recommendations  No OT follow up    Barriers to Discharge  None, pt's husband will be at home to assist her 24 hrs/day    Equipment Recommendations  Tub/shower bench;Other (comment) (ADL A/E kit)    Recommendations for Other Services    Frequency       Precautions / Restrictions Precautions Precautions: Back Precaution Comments: pt able to recall 3/3 back precautions Required Braces or Orthoses: Spinal Brace Spinal Brace: Lumbar corset Restrictions Weight Bearing Restrictions: No       ADL  Grooming: Performed;Wash/dry hands;Wash/dry face;Brushing hair;Supervision/safety Where Assessed - Grooming: Unsupported standing Upper Body Bathing: Simulated;Supervision/safety;Set up;Other (comment) (assist to donn back brace) Lower Body Bathing: Simulated;Supervision/safety;Set up;Min guard Upper Body Dressing: Performed;Supervision/safety;Set up Lower Body Dressing: Performed;Supervision/safety;Set up;Min guard Where Assessed - Lower Body Dressing: Unsupported sitting;Supported sit to stand Toilet Transfer: Performed;Modified independent Toilet Transfer Method: Sit to Barista: Regular height toilet;Raised toilet seat with arms (or 3-in-1 over toilet) Toileting - Clothing Manipulation and Hygiene: Performed;Supervision/safety;Min guard Where Assessed - Engineer, mining and Hygiene: Standing Tub/Shower Transfer:  Performed;Modified independent Tub/Shower Transfer Method: Science writer: Counsellor Used: Back brace;Long-handled shoe horn;Long-handled sponge;Reacher;Rolling walker;Gait belt;Sock aid;Other (comment) (tub bench) ADL Comments: Pt provded with education and demo of ADL A/E and tub bench for use at home    OT Diagnosis:    OT Problem List:   OT Treatment Interventions:     OT Goals    Visit Information  Last OT Received On: 06/30/12    Subjective Data  Subjective: " I hope to go home today or tomorrow " Patient Stated Goal: To return home with my husband   Prior Functioning     Home Living Lives With: Spouse Available Help at Discharge: Family Type of Home: House Home Access: Level entry Home Layout: One level Bathroom Shower/Tub: Engineer, manufacturing systems: Standard Bathroom Accessibility: Yes Home Adaptive Equipment: None Prior Function Level of Independence: Independent Able to Take Stairs?: No Driving: Yes Vocation: Full time employment Communication Communication: No difficulties Dominant Hand: Right         Vision/Perception Vision - History Baseline Vision: Wears glasses only for reading Patient Visual Report: No change from baseline Perception Perception: Within Functional Limits   Cognition  Cognition Overall Cognitive Status: Appears within functional limits for tasks assessed/performed Arousal/Alertness: Awake/alert Orientation Level: Oriented X4 / Intact Behavior During Session: [facility] for tasks performed    Extremity/Trunk Assessment Right Upper Extremity Assessment RUE ROM/Strength/Tone: [facility] for tasks assessed Left Upper Extremity Assessment LUE ROM/Strength/Tone: WFL for tasks assessed Right Lower Extremity Assessment RLE ROM/Strength/Tone: Within functional levels RLE Sensation: WFL - Light Touch Left Lower Extremity Assessment LLE ROM/Strength/Tone: Within functional levels LLE  Sensation: WFL - Light Touch     Mobility Bed Mobility Bed Mobility: Sit to Sidelying Left;Left Sidelying to Sit Left Sidelying to Sit: 6: Modified independent (Device/Increase time) Sit to Sidelying Left:  6: Modified independent (Device/Increase time) Details for Bed Mobility Assistance: Pt required cuing regarding log rolling Transfers Transfers: Sit to Stand;Stand to Sit Sit to Stand: From bed;From chair/3-in-1;From toilet;Other (comment);6: Modified independent (Device/Increase time) (tub bench) Stand to Sit: To bed;To chair/3-in-1;To toilet;Other (comment);6: Modified independent (Device/Increase time) (tub bench)     Exercise     Balance Balance Balance Assessed: No   End of Session OT - End of Session Equipment Utilized During Treatment: Gait belt;Back brace;Other (comment) (tub bench, 3 in 1, ADL A/E) Activity Tolerance: Patient tolerated treatment well Patient left: in chair;with call bell/phone within reach  GO     Galen Manila 06/30/2012, 12:22 PM

## 2012-06-30 NOTE — Progress Notes (Signed)
Subjective: Patient reports "I feel real good." "I've been walking in the room"  Objective: Vital signs in last 24 hours: Temp:  [97.2 F (36.2 C)-98.7 F (37.1 C)] 98.1 F (36.7 C) (04/11 0700) Pulse Rate:  [58-87] 87 (04/11 0700) Resp:  [9-23] 9 (04/11 0700) BP: (96-141)/(46-79) 105/54 mmHg (04/11 0700) SpO2:  [95 %-100 %] 95 % (04/11 0700) Weight:  [63.504 kg (140 lb)] 63.504 kg (140 lb) (04/10 1458)  Intake/Output from previous day: 04/10 0701 - 04/11 0700 In: 3605 [P.O.:480; I.V.:3125] Out: 3450 [Urine:3400; Blood:50] Intake/Output this shift:    Alert, conversant. Mild incisional tenderness only. No erythema, swelling, or drainage, Dermabond intact. BS active. Belly soft, nontender. Good strength BLE.  Lab Results:  Recent Labs  06/29/12 1520  WBC 12.6*  HGB 12.5  HCT 34.8*  PLT 206   BMET  Recent Labs  06/29/12 1520  CREATININE 0.59    Studies/Results: Dg Lumbar Spine 2-3 Views  06/29/2012  *RADIOLOGY REPORT*  Clinical Data: Back pain  DG C-ARM 61-120 MIN,LUMBAR SPINE - 2-3 VIEW  Comparison: Plain films 10/26/2010.  Findings: C-arm films document L5-S1anterior lumbar interbody fusion.  Satisfactory position and alignment.  IMPRESSION: As above.   Original Report Authenticated By: Davonna Belling, M.D.    Dg C-arm [phone] Min  06/29/2012  *RADIOLOGY REPORT*  Clinical Data: Back pain  DG C-ARM 61-120 MIN,LUMBAR SPINE - 2-3 VIEW  Comparison: Plain films 10/26/2010.  Findings: C-arm films document L5-S1anterior lumbar interbody fusion.  Satisfactory position and alignment.  IMPRESSION: As above.   Original Report Authenticated By: Davonna Belling, M.D.    Dg Or Local Abdomen  06/29/2012  *RADIOLOGY REPORT*  Clinical Data: Final instrument count.  OR LOCAL ABDOMEN  Comparison: 10/26/2010  Findings: There is surgical hardware at L5-S1.  No unexpected foreign bodies within the abdomen.  Nonspecific bowel gas pattern.  IMPRESSION: Postoperative changes.  No unexpected foreign  bodies or instruments.   Original Report Authenticated By: Richarda Overlie, M.D.     Assessment/Plan: Improving   LOS: 1 day  Per Dr. Venetia Maxon, d/c PCA. Mobilize in LSO. D/C IVF when eating well.   Georgiann Cocker 06/30/2012, 8:10 AM

## 2012-06-30 NOTE — Progress Notes (Signed)
Utilization Review Completed.Debra Khan T4/01/2013  

## 2012-06-30 NOTE — Progress Notes (Signed)
VASCULAR PROGRESS NOTE  SUBJECTIVE: Pain well controlled.  PHYSICAL EXAM: Filed Vitals:   06/30/12 0100 06/30/12 0300 06/30/12 0557 06/30/12 0700  BP:  96/46  105/54  Pulse:  71  87  Temp:  98.1 F (36.7 C)  98.1 F (36.7 C)  TempSrc:  Oral  Oral  Resp: 14 12 15 9   Height:      Weight:      SpO2: 98% 97% 97% 95%   Palpable left DP pulse Abd soft, NT  LABS: Lab Results  Component Value Date   WBC 12.6* 06/29/2012   HGB 12.5 06/29/2012   HCT 34.8* 06/29/2012   MCV 87.7 06/29/2012   PLT 206 06/29/2012   Lab Results  Component Value Date   CREATININE 0.59 06/29/2012   ASSESSMENT AND PLAN: 1. 1 Day Post-Op s/p: Ant RP exposure L5-S1 2. Doing well. Will be available if needed.  Cari Caraway Beeper: [phone] 06/30/2012

## 2012-06-30 NOTE — Progress Notes (Signed)
Physical Therapy Evaluation Patient Details Name: Debra Khan MRN: [id number] DOB: [birth year] Today's Date: 06/30/2012 Time: 7829-5621 PT Time Calculation (min): 34 min  PT Assessment / Plan / Recommendation Clinical Impression  Pt is 62 year old female who received L5-S1 fusion- post op day 1. Pt presents some soreness at the incision. Pt ambulates well and no follow-up needed for PT post discharge.    PT Assessment  Patient needs continued PT services    Follow Up Recommendations  No PT follow up    Does the patient have the potential to tolerate intense rehabilitation    NA  Barriers to Discharge NoneNA None    Equipment Recommendations  None recommended by PT    Recommendations for Other Services   none  Frequency Min 5X/week    Precautions / Restrictions Precautions Precautions: Back Precaution Comments: pt able to recall 3/3 back precautions Required Braces or Orthoses: Spinal Brace Spinal Brace: Lumbar corset Restrictions Weight Bearing Restrictions: No   Pertinent Vitals/Pain See vitals flow sheet      Mobility  Bed Mobility Bed Mobility: Sit to Sidelying Left;Left Sidelying to Sit Left Sidelying to Sit: 6: Modified independent (Device/Increase time) Sit to Sidelying Left: 6: Modified independent (Device/Increase time) Details for Bed Mobility Assistance: Pt required cuing regarding log rolling Transfers Transfers: Sit to Stand;Stand to Sit Sit to Stand: 7: Independent Stand to Sit: 7: Independent Ambulation/Gait Ambulation/Gait Assistance: 7: Independent Assistive device: None Gait Pattern: Step-through pattern (Guarded left arm possibly due to IV) Gait velocity: 2.14 ft.sec Stairs: No      PT Goals Acute Rehab PT Goals PT Goal Formulation: With patient Time For Goal Achievement: 07/07/12 Potential to Achieve Goals: Good Pt will Roll Supine to Right Side: Independently PT Goal: Rolling Supine to Right Side - Progress: Goal set today Pt  will go Supine/Side to Sit: Independently PT Goal: Supine/Side to Sit - Progress: Goal set today Additional Goals Additional Goal #1: Pt will recall alll 3 back precautions  Visit Information  Last PT Received On: 06/30/12 Assistance Needed: +1    Subjective Data  Subjective: Pt has been walking independently in room Patient Stated Goal: go home decrease back pain   Prior Functioning  Home Living Lives With: Spouse Available Help at Discharge: Family Type of Home: House Home Access: Level entry Home Layout: One level Bathroom Shower/Tub: Engineer, manufacturing systems: Standard Bathroom Accessibility: Yes Home Adaptive Equipment: None Prior Function Level of Independence: Independent Able to Take Stairs?: No Driving: Yes Vocation: Full time employment Communication Communication: No difficulties Dominant Hand: Right    Cognition  Cognition Overall Cognitive Status: Appears within functional limits for tasks assessed/performed Arousal/Alertness: Awake/alert Orientation Level: Appears intact for tasks assessed Behavior During Session: [facility] for tasks performed    Extremity/Trunk Assessment Right Lower Extremity Assessment RLE ROM/Strength/Tone: Within functional levels RLE Sensation: WFL - Light Touch Left Lower Extremity Assessment LLE ROM/Strength/Tone: Within functional levels LLE Sensation: WFL - Light Touch       End of Session PT - End of Session Equipment Utilized During Treatment: Gait belt;Back brace Activity Tolerance: Patient tolerated treatment well Patient left: in bed;with call bell/phone within reach;with family/visitor present     Lurena Joiner B. Kimarie Coor, PT, DPT [phone]   06/30/2012, 4:48 PM

## 2012-07-01 NOTE — Discharge Summary (Signed)
Physician Discharge Summary  Patient ID: KRISTIANE MORSCH MRN: [id number] DOB/AGE: 06/15/1950 62 y.o.  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 Venetia Maxon who performed a L5-S1 ALIF with the assistance of Dr. Cari Caraway. Postoperatively she has done well. She is up and ambulating. She is asking to be discharged to home. She was given prescriptions for Percocet and Valium by Dr. Venetia Maxon. She's been given instructions regarding wound care and activities. She is to return for followup with Dr. Venetia Maxon in 2 weeks.  Discharge Exam: Blood pressure 128/78, pulse 98, temperature 98.8 F (37.1 C), temperature source Oral, resp. rate 18, height 5\' 4"  (1.626 m), weight 63.504 kg (140 lb), SpO2 95.00%.  Disposition: Home     Medication List    TAKE these medications       OVER THE COUNTER MEDICATION  Take 1 packet by mouth daily as needed (if expsed to illness, or feeling ill). Emergen-C         Signed: Hewitt Shorts, MD 07/01/2012, 8:34 AM

## 2012-07-01 NOTE — Plan of Care (Signed)
Problem: Phase III Progression Outcomes Goal: Pain controlled on oral analgesia Outcome: Completed/Met Date Met:  07/01/12 Patient reports adequate pain relief with combination of pain meds and valium. Goal: Activity at appropriate level-compared to baseline (UP IN CHAIR FOR HEMODIALYSIS)  Outcome: Completed/Met Date Met:  07/01/12 Patient able to don brace and ambulate in a stable manner. Goal: Demonstrates proper use of assistive devices Outcome: Completed/Met Date Met:  07/01/12 Able to use brace as instructed. Goal: Discharge plan remains appropriate-arrangements made Outcome: Completed/Met Date Met:  07/01/12 Patient will be discharged to home with brace, prescriptions and discharge instructions.  Problem: Discharge Progression Outcomes Goal: Discharge plan in place and appropriate Outcome: Completed/Met Date Met:  07/01/12 Patient will be returning home with spouse.  Taking brace to wear when she is up and active. Goal: Hemodynamically stable Outcome: Completed/Met Date Met:  07/01/12 Vitals remain stable for discharge. Goal: Tolerates diet Outcome: Adequate for Discharge Patient appetite is fair but feels it will improve upon returning home. Goal: Incision without S/S infection Outcome: Completed/Met Date Met:  07/01/12 Patient incision is approximated and secured with dermabond.  No active drainage noted from the site. Goal: Demonstrates proper use of assistive devices Outcome: Completed/Met Date Met:  07/01/12 Patient able to don brace appropriately and adjust for comfort.

## 2012-07-01 NOTE — Progress Notes (Signed)
PT Cancellation Note  Patient Details Name: Debra Khan MRN: [id number] DOB: 10-01-1950   Cancelled Treatment:     Pt d/cing home.      Verdell Face, [place] [phone] 07/01/2012

## 2012-07-03 MED FILL — Heparin Sodium (Porcine) Inj 1000 Unit/ML: INTRAMUSCULAR | Qty: 30 | Status: AC

## 2012-07-03 MED FILL — Sodium Chloride IV Soln 0.9%: INTRAVENOUS | Qty: 1000 | Status: AC

## 2012-08-25 ENCOUNTER — Ambulatory Visit: Payer: PRIVATE HEALTH INSURANCE

## 2012-10-19 ENCOUNTER — Ambulatory Visit (INDEPENDENT_AMBULATORY_CARE_PROVIDER_SITE_OTHER): Payer: PRIVATE HEALTH INSURANCE | Admitting: *Deleted

## 2012-10-19 DIAGNOSIS — Z23 Encounter for immunization: Secondary | ICD-10-CM

## 2014-07-17 LAB — HM COLONOSCOPY

## 2014-11-30 IMAGING — RF DG C-ARM 61-120 MIN
1 series · 2 of 2 positions shown · non-contrast
Comparison: Plain films 10/26/2010.

CLINICAL DATA: Back pain

DG C-ARM 61-120 MIN,LUMBAR SPINE - 2-3 VIEW

[Series 1: run · 2 of 2 slices shown]
[im 1/2]
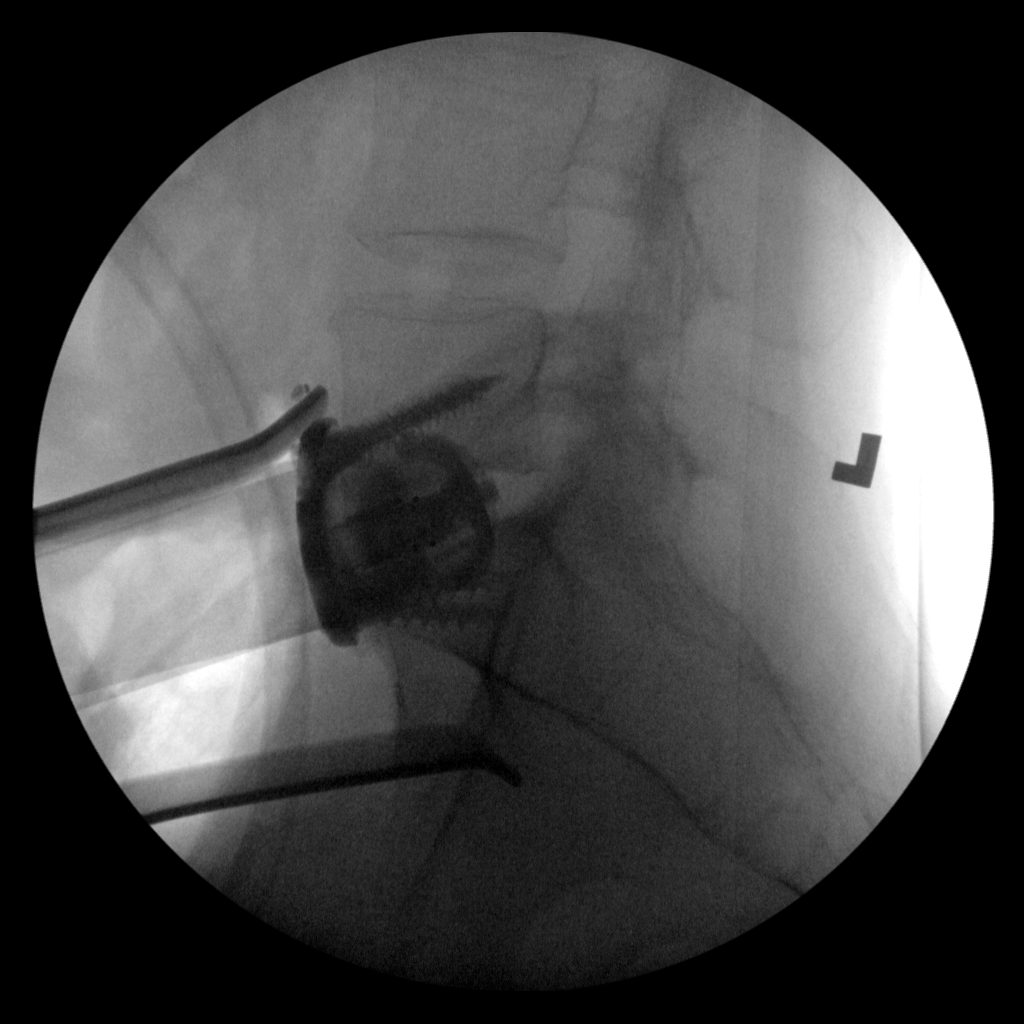
[im 2/2]
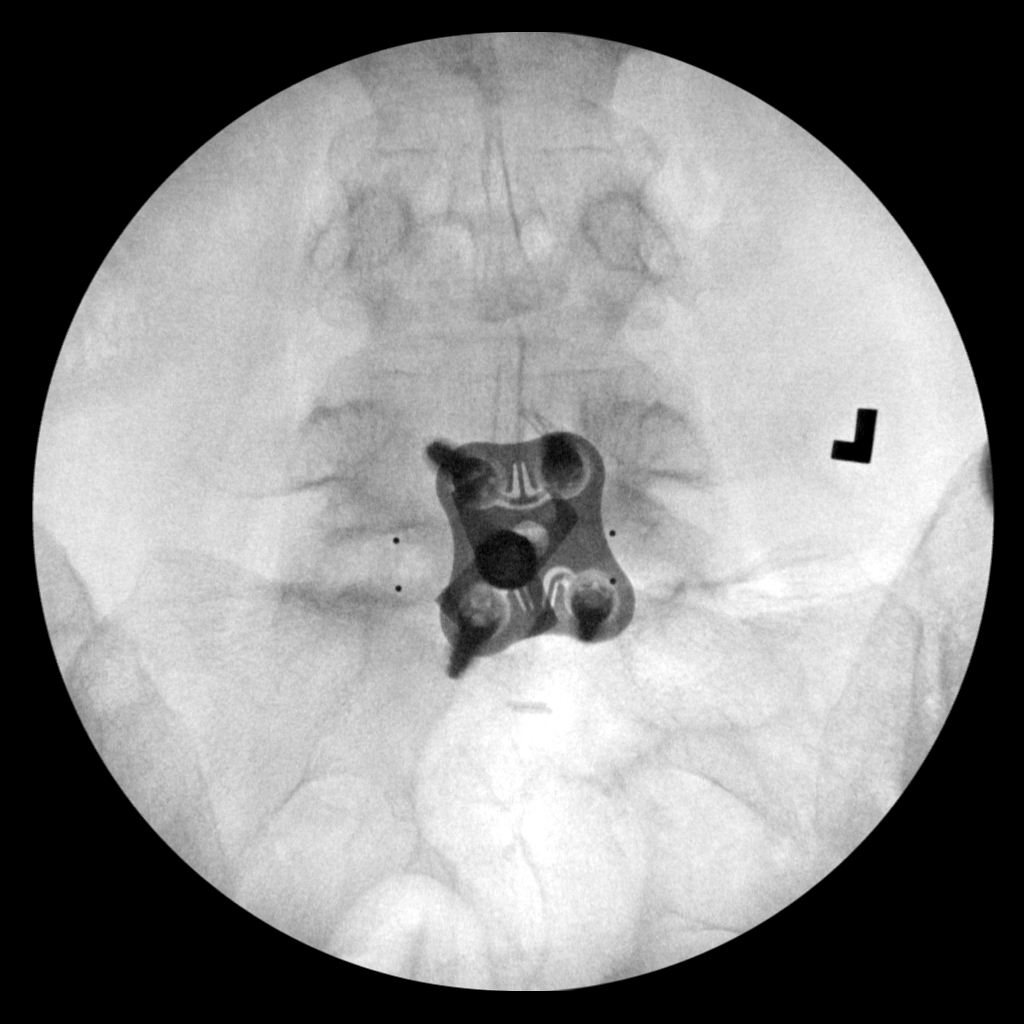

[2 of 2 positions shown; findings below may reference images not displayed]

FINDINGS: C-arm films document L5-89anterior lumbar interbody
fusion.  Satisfactory position and alignment.
IMPRESSION: As above.

## 2014-11-30 IMAGING — CR DG OR LOCAL ABDOMEN
1 series · 1 of 1 positions shown · non-contrast
Comparison: 10/26/2010

CLINICAL DATA: Final instrument count.

OR LOCAL ABDOMEN

[AP]
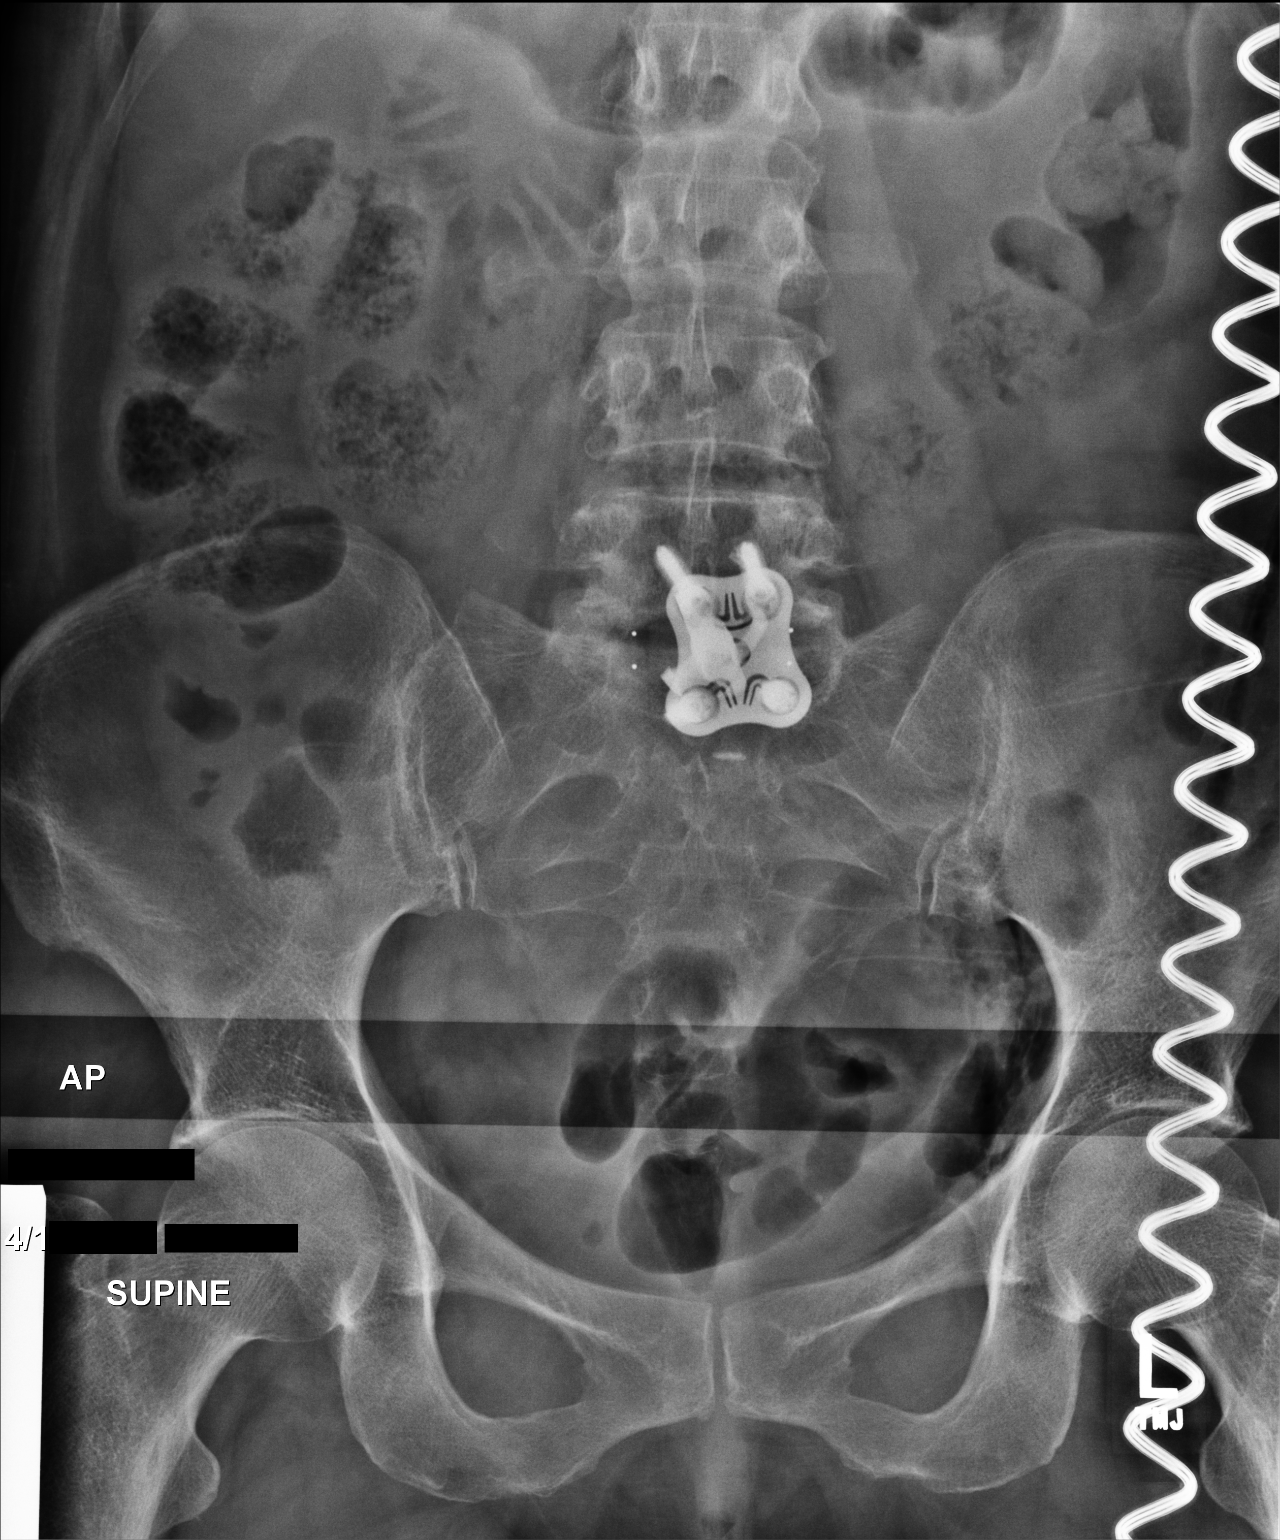

[1 of 1 positions shown; findings below may reference images not displayed]

FINDINGS: There is surgical hardware at L5-S1.  No unexpected
foreign bodies within the abdomen.  Nonspecific bowel gas pattern.
IMPRESSION: Postoperative changes.  No unexpected foreign bodies or
instruments.

## 2015-12-19 LAB — BASIC METABOLIC PANEL
BUN: 17 mg/dL (ref 4–21)
Creatinine: 0.8 mg/dL (ref <=1.1)
Glucose: 100 mg/dL
Potassium: 4.7 mmol/L (ref 3.4–5.3)
SODIUM: 143 mmol/L (ref 137–147)

## 2015-12-19 LAB — HEPATIC FUNCTION PANEL
ALT: 29 U/L (ref 7–35)
AST: 24 U/L (ref 13–35)
Alkaline Phosphatase: 81 U/L (ref 25–125)
Bilirubin, Total: 0.9 mg/dL

## 2015-12-19 LAB — LIPID PANEL
CHOLESTEROL: 213 mg/dL — AB (ref 0–200)
HDL: 55 mg/dL (ref 35–70)
LDL CALC: 123 mg/dL
TRIGLYCERIDES: 175 mg/dL — AB (ref 40–160)

## 2016-01-05 LAB — HM MAMMOGRAPHY

## 2016-01-16 LAB — HM MAMMOGRAPHY: HM Mammogram: NORMAL (ref 0–4)

## 2016-06-15 DIAGNOSIS — Z0000 Encounter for general adult medical examination without abnormal findings: Secondary | ICD-10-CM | POA: Diagnosis not present

## 2016-06-15 DIAGNOSIS — R946 Abnormal results of thyroid function studies: Secondary | ICD-10-CM | POA: Diagnosis not present

## 2016-06-15 DIAGNOSIS — G4700 Insomnia, unspecified: Secondary | ICD-10-CM | POA: Diagnosis not present

## 2016-06-15 DIAGNOSIS — Z1159 Encounter for screening for other viral diseases: Secondary | ICD-10-CM | POA: Diagnosis not present

## 2016-06-15 LAB — TSH: TSH: 3.63 u[IU]/mL (ref <=5.90)

## 2016-07-08 DIAGNOSIS — H35033 Hypertensive retinopathy, bilateral: Secondary | ICD-10-CM | POA: Diagnosis not present

## 2016-07-08 DIAGNOSIS — H2513 Age-related nuclear cataract, bilateral: Secondary | ICD-10-CM | POA: Diagnosis not present

## 2016-07-08 DIAGNOSIS — H04123 Dry eye syndrome of bilateral lacrimal glands: Secondary | ICD-10-CM | POA: Diagnosis not present

## 2016-07-08 DIAGNOSIS — H25013 Cortical age-related cataract, bilateral: Secondary | ICD-10-CM | POA: Diagnosis not present

## 2016-07-16 ENCOUNTER — Ambulatory Visit (INDEPENDENT_AMBULATORY_CARE_PROVIDER_SITE_OTHER): Payer: Medicare Other | Admitting: Physician Assistant

## 2016-07-16 ENCOUNTER — Encounter: Payer: Self-pay | Admitting: Physician Assistant

## 2016-07-16 VITALS — BP 126/80 | HR 69 | Temp 98.0°F | Resp 14 | Ht 64.0 in | Wt 137.0 lb

## 2016-07-16 DIAGNOSIS — Z780 Asymptomatic menopausal state: Secondary | ICD-10-CM | POA: Diagnosis not present

## 2016-07-16 DIAGNOSIS — I7300 Raynaud's syndrome without gangrene: Secondary | ICD-10-CM

## 2016-07-16 MED ORDER — AMLODIPINE BESYLATE 2.5 MG PO TABS: 2.5000 mg | ORAL_TABLET | Freq: Every day | ORAL | 1 refills | Status: DC

## 2016-07-16 NOTE — Patient Instructions (Signed)
Please keep hands as warm as possible.  Start the low dose of the amlodipine daily. We will see how this does for symptoms. Follow-up with me in 2-3 weeks for reassessment.   Let me know if there are any new or worsening symptoms.  I will be getting your records so I can review them.   Raynaud Phenomenon Raynaud phenomenon is a condition that affects the blood vessels (arteries) that carry blood to your fingers and toes. The arteries that supply blood to your ears or the tip of your nose might also be affected. Raynaud phenomenon causes the arteries to temporarily narrow. As a result, the flow of blood to the affected areas is temporarily decreased. This usually occurs in response to cold temperatures or stress. During an attack, the skin in the affected areas turns white. You may also feel tingling or numbness in those areas. Attacks usually last for only a brief period, and then the blood flow to the area returns to normal. In most cases, Raynaud phenomenon does not cause serious health problems. What are the causes? For many people with this condition, the cause is not known. Raynaud phenomenon is sometimes associated with other diseases, such as scleroderma or lupus. What increases the risk? Raynaud phenomenon can affect anyone, but it develops most often in people who are 44-43 years old. It affects more females than males. What are the signs or symptoms? Symptoms of Raynaud phenomenon may occur when you are exposed to cold temperatures or when you have emotional stress. The symptoms may last for a few minutes or up to several hours. They usually affect your fingers but may also affect your toes, ears, or the tip of your nose. Symptoms may include:  Changes in skin color. The skin in the affected areas will turn pale or white. The skin may then change from white to bluish to red as normal blood flow returns to the area.  Numbness, tingling, or pain in the affected areas. In severe cases,  sores may develop in the affected areas. How is this diagnosed? Your health care provider will do a physical exam and take your medical history. You may be asked to put your hands in cold water to check for a reaction to cold temperature. Blood tests may be done to check for other diseases or conditions. Your health care provider may also order a test to check the movement of blood through your arteries and veins (vascular ultrasound). How is this treated? Treatment often involves making lifestyle changes and taking steps to control your exposure to cold temperatures. For more severe cases, medicine (calcium channel blockers) may be used to improve blood flow. Surgery is sometimes done to block the nerves that control the affected arteries, but this is rare. Follow these instructions at home:  Avoid exposure to cold by taking these steps:  If possible, stay indoors during cold weather.  When you go outside during cold weather, dress in layers and wear mittens, a hat, a scarf, and warm footwear.  Wear mittens or gloves when handling ice or frozen food.  Use holders for glasses or cans containing cold drinks.  Let warm water run for a while before taking a shower or bath.  Warm up the car before driving in cold weather.  If possible, avoid stressful and emotional situations. Exercise, meditation, and yoga may help you cope with stress. Biofeedback may be useful.  Do not use any tobacco products, including cigarettes, chewing tobacco, or electronic cigarettes. If you need  help quitting, ask your health care provider.  Avoid secondhand smoke.  Limit your use of caffeine. Switch to decaffeinated coffee, tea, and soda. Avoid chocolate.  Wear loose fitting socks and comfortable, roomy shoes.  Avoid vibrating tools and machinery.  Take medicines only as directed by your health care provider. Contact a health care provider if:  Your discomfort becomes worse despite lifestyle  changes.  You develop sores on your fingers or toes that do not heal.  Your fingers or toes turn black.  You have breaks in the skin on your fingers or toes.  You have a fever.  You have pain or swelling in your joints.  You have a rash.  Your symptoms occur on only one side of your body. This information is not intended to replace advice given to you by your health care provider. Make sure you discuss any questions you have with your health care provider. Document Released: 03/05/2000 Document Revised: 08/14/2015 Document Reviewed: 09/10/2015 Elsevier Interactive Patient Education  2017 [employer].
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
## 2016-07-16 NOTE — Progress Notes (Signed)
Pre visit review using our clinic review tool, if applicable. No additional management support is needed unless otherwise documented below in the visit note. 

## 2016-07-18 DIAGNOSIS — Z780 Asymptomatic menopausal state: Secondary | ICD-10-CM | POA: Insufficient documentation

## 2016-07-18 DIAGNOSIS — I7300 Raynaud's syndrome without gangrene: Secondary | ICD-10-CM | POA: Insufficient documentation

## 2016-07-18 NOTE — Assessment & Plan Note (Signed)
Good blood flow on examination -- pulses equal bilaterally. Good capillary refill. Unable to reproduce symptoms when running patients fingers under cool water today. History consistent with Raynauds. Reviewed supportive measures. Will start rial of amlodipine 2.5 mg to help with symptoms. Follow-up scheduled for reassessment.

## 2016-07-18 NOTE — Assessment & Plan Note (Addendum)
Order for bone density placed. CPE scheduled. Will check Vitamin D levels at that time.

## 2016-07-30 ENCOUNTER — Encounter: Payer: Self-pay | Admitting: Physician Assistant

## 2016-07-30 ENCOUNTER — Ambulatory Visit (INDEPENDENT_AMBULATORY_CARE_PROVIDER_SITE_OTHER): Payer: Medicare Other | Admitting: Physician Assistant

## 2016-07-30 DIAGNOSIS — I7300 Raynaud's syndrome without gangrene: Secondary | ICD-10-CM
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
## 2016-07-30 NOTE — Patient Instructions (Signed)
Please continue medication as directed. You can try 1/2 tablet daily to see if it helps with symptoms the same.  Follow-up in 3 months.  Schedule a complete physical at your earliest convenience.

## 2016-07-30 NOTE — Progress Notes (Signed)
Pre visit review using our clinic review tool, if applicable. No additional management support is needed unless otherwise documented below in the visit note. 

## 2016-07-30 NOTE — Assessment & Plan Note (Signed)
Much improved. BP stable. Asymptomatic. Continue current regimen. Follow-up scheduled.

## 2016-08-04 ENCOUNTER — Encounter: Payer: Self-pay | Admitting: Emergency Medicine

## 2016-08-17 ENCOUNTER — Telehealth: Payer: Self-pay | Admitting: Physician Assistant

## 2016-08-17 ENCOUNTER — Encounter: Payer: Self-pay | Admitting: Physician Assistant

## 2016-08-17 DIAGNOSIS — M8580 Other specified disorders of bone density and structure, unspecified site: Secondary | ICD-10-CM | POA: Insufficient documentation

## 2016-08-17 DIAGNOSIS — N3010 Interstitial cystitis (chronic) without hematuria: Secondary | ICD-10-CM | POA: Insufficient documentation

## 2016-08-17 NOTE — Telephone Encounter (Signed)
Received and reviewed prior medical records from [facility]. Is due for repeat DEXA due to diagnosis of Osteopenia and last scan in 2012. I would like patient to complete DEXA scan. If she is willing, ok to place order - Dx: osteopenia.

## 2016-08-19 NOTE — Telephone Encounter (Signed)
Left detailed message of patient is due for repeat Dexa. Last dexa was 2012. If she is agreeable will order the Dexa dx: Osteopenia

## 2016-08-26 NOTE — Telephone Encounter (Signed)
Spoke with patient and she states she is scheduled for her Dexa in [place] for the 09/13/16. Advised patient to notify who her PCP and we will receive the results. She denies taking any calcium and vitamin d supplement.

## 2016-09-13 ENCOUNTER — Telehealth: Payer: Self-pay | Admitting: Physician Assistant

## 2016-09-13 ENCOUNTER — Other Ambulatory Visit: Payer: Self-pay | Admitting: Emergency Medicine

## 2016-09-13 DIAGNOSIS — M8588 Other specified disorders of bone density and structure, other site: Secondary | ICD-10-CM | POA: Diagnosis not present

## 2016-09-13 DIAGNOSIS — Z8489 Family history of other specified conditions: Secondary | ICD-10-CM | POA: Diagnosis not present

## 2016-09-13 DIAGNOSIS — M1990 Unspecified osteoarthritis, unspecified site: Secondary | ICD-10-CM | POA: Diagnosis not present

## 2016-09-13 DIAGNOSIS — Z780 Asymptomatic menopausal state: Secondary | ICD-10-CM | POA: Diagnosis not present

## 2016-09-13 DIAGNOSIS — I7300 Raynaud's syndrome without gangrene: Secondary | ICD-10-CM

## 2016-09-13 DIAGNOSIS — Z79899 Other long term (current) drug therapy: Secondary | ICD-10-CM | POA: Diagnosis not present

## 2016-09-13 LAB — HM DEXA SCAN

## 2016-09-13 MED ORDER — AMLODIPINE BESYLATE 2.5 MG PO TABS: 2.5000 mg | ORAL_TABLET | Freq: Every day | ORAL | 1 refills | Status: DC

## 2016-09-13 NOTE — Telephone Encounter (Signed)
Reviewing patient chart. She had her Dexa completed in [place]. Waiting on results. Once they arrive will have [place] review and call patient with results.

## 2016-09-13 NOTE — Telephone Encounter (Signed)
Debra Khan Self [phone]  [name] called to say she would like to be called with her Dexa scan results.

## 2016-09-16 DIAGNOSIS — H00024 Hordeolum internum left upper eyelid: Secondary | ICD-10-CM | POA: Diagnosis not present

## 2016-09-16 NOTE — Telephone Encounter (Signed)
We may need to find the location she had the bone density so these records can be requested as I have not received anything. Please let patient know this and proceed with requesting results.

## 2016-09-17 NOTE — Telephone Encounter (Signed)
LMOVM advising patient we just received the results in the mail. Will have PCP review and call back with results

## 2016-09-20 ENCOUNTER — Telehealth: Payer: Self-pay | Admitting: Physician Assistant

## 2016-09-20 DIAGNOSIS — M8580 Other specified disorders of bone density and structure, unspecified site: Secondary | ICD-10-CM

## 2016-09-20 NOTE — Telephone Encounter (Signed)
Please inform patient that I did finally receive her bone density results. It shows that she does have osteopenia. Her FRAX score (marker of 10-year probability of fractures) is under the cut off where we would need to treat like this was osteoporosis.  I recommend that she start do some low-weight resistance training to strengthen bones. Start Citracal-D supplement daily. Would recommend she come in for a lab visit to check vitamin D levels. Repeat DEXA in 2 years.

## 2016-09-21 NOTE — Telephone Encounter (Signed)
Advised patient of osteopenia. Encouraged to restart the Citracal+D supplement and low resistant exercise to help build up bone strength. She is scheduled for lab visit for vitamin d level.

## 2016-09-21 NOTE — Addendum Note (Signed)
Addended by: Leonidas Romberg on: 09/21/2016 11:58 AM   Modules accepted: Orders

## 2016-09-23 ENCOUNTER — Other Ambulatory Visit: Payer: Self-pay | Admitting: *Deleted

## 2016-09-23 ENCOUNTER — Other Ambulatory Visit (INDEPENDENT_AMBULATORY_CARE_PROVIDER_SITE_OTHER): Payer: Medicare Other

## 2016-09-23 DIAGNOSIS — M8580 Other specified disorders of bone density and structure, unspecified site: Secondary | ICD-10-CM

## 2016-09-23 DIAGNOSIS — M810 Age-related osteoporosis without current pathological fracture: Secondary | ICD-10-CM | POA: Diagnosis not present

## 2016-09-23 LAB — VITAMIN D 25 HYDROXY (VIT D DEFICIENCY, FRACTURES): VITD: 32.08 ng/mL (ref 30.00–100.00)

## 2016-09-23 MED ORDER — CALCIUM CITRATE-VITAMIN D 250-200 MG-UNIT PO TABS: 1.0000 | ORAL_TABLET | Freq: Every day | ORAL | 0 refills | Status: DC

## 2016-09-23 NOTE — Addendum Note (Signed)
Addended by: Leonidas Romberg on: 09/23/2016 02:01 PM   Modules accepted: Orders

## 2016-09-30 ENCOUNTER — Other Ambulatory Visit: Payer: Self-pay | Admitting: Physician Assistant

## 2016-09-30 DIAGNOSIS — I7300 Raynaud's syndrome without gangrene: Secondary | ICD-10-CM

## 2016-11-02 ENCOUNTER — Ambulatory Visit (INDEPENDENT_AMBULATORY_CARE_PROVIDER_SITE_OTHER): Payer: Medicare Other | Admitting: Physician Assistant

## 2016-11-02 ENCOUNTER — Encounter: Payer: Self-pay | Admitting: Physician Assistant

## 2016-11-02 DIAGNOSIS — I7300 Raynaud's syndrome without gangrene: Secondary | ICD-10-CM | POA: Diagnosis not present

## 2016-11-02 NOTE — Progress Notes (Signed)
Pre visit review using our clinic review tool, if applicable. No additional management support is needed unless otherwise documented below in the visit note. 

## 2016-11-02 NOTE — Patient Instructions (Signed)
Everything looks good today.  Please continue current regimen. Follow-up with me in March when you are due for your physical.  Return sooner if needed.
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
## 2016-11-02 NOTE — Assessment & Plan Note (Signed)
BP and HR stable. Patient doing very well. Continue current regimen and supportive measures. Winter-time will be the big test. Follow-up discussed.

## 2016-11-19 DIAGNOSIS — H00024 Hordeolum internum left upper eyelid: Secondary | ICD-10-CM | POA: Diagnosis not present

## 2017-04-16 ENCOUNTER — Other Ambulatory Visit: Payer: Self-pay | Admitting: Physician Assistant

## 2017-04-16 DIAGNOSIS — I7300 Raynaud's syndrome without gangrene: Secondary | ICD-10-CM

## 2017-06-14 ENCOUNTER — Other Ambulatory Visit: Payer: Self-pay

## 2017-06-14 ENCOUNTER — Ambulatory Visit (INDEPENDENT_AMBULATORY_CARE_PROVIDER_SITE_OTHER): Payer: Medicare Other | Admitting: Physician Assistant

## 2017-06-14 ENCOUNTER — Encounter: Payer: Self-pay | Admitting: Physician Assistant

## 2017-06-14 VITALS — BP 118/72 | HR 81 | Temp 98.0°F | Resp 16 | Ht 64.0 in | Wt 145.8 lb

## 2017-06-14 DIAGNOSIS — E010 Iodine-deficiency related diffuse (endemic) goiter: Secondary | ICD-10-CM

## 2017-06-14 DIAGNOSIS — Z0000 Encounter for general adult medical examination without abnormal findings: Secondary | ICD-10-CM

## 2017-06-14 LAB — LIPID PANEL
CHOL/HDL RATIO: 3
Cholesterol: 200 mg/dL (ref 0–200)
HDL: 58.8 mg/dL (ref >39.00)
LDL CALC: 118 mg/dL — AB (ref 0–99)
NONHDL: 140.76
Triglycerides: 115 mg/dL (ref 0.0–149.0)
VLDL: 23 mg/dL (ref 0.0–40.0)

## 2017-06-14 LAB — COMPREHENSIVE METABOLIC PANEL
ALT: 52 U/L — AB (ref 0–35)
AST: 34 U/L (ref 0–37)
Albumin: 4.5 g/dL (ref 3.5–5.2)
Alkaline Phosphatase: 71 U/L (ref 39–117)
BILIRUBIN TOTAL: 0.8 mg/dL (ref 0.2–1.2)
BUN: 13 mg/dL (ref 6–23)
CHLORIDE: 107 meq/L (ref 96–112)
CO2: 29 meq/L (ref 19–32)
Calcium: 9.6 mg/dL (ref 8.4–10.5)
Creatinine, Ser: 0.77 mg/dL (ref 0.40–1.20)
GFR: 79.71 mL/min (ref >60.00)
GLUCOSE: 117 mg/dL — AB (ref 70–99)
Potassium: 3.9 mEq/L (ref 3.5–5.1)
Sodium: 143 mEq/L (ref 135–145)
Total Protein: 7.5 g/dL (ref 6.0–8.3)

## 2017-06-14 LAB — TSH: TSH: 2.83 u[IU]/mL (ref 0.35–4.50)

## 2017-06-14 NOTE — Patient Instructions (Signed)
Please go to the lab for blood work.   Our office will call you with your results unless you have chosen to receive results via MyChart.  If your blood work is normal we will follow-up each year for physicals and as scheduled for chronic medical problems.  If anything is abnormal we will treat accordingly and get you in for a follow-up.  Please follow-up with your eye doctor as scheduled. Schedule your mammogram.   Please bring by a copy of your advanced directive at your earliest convenience.    Preventive Care 66 Years and Older, Female Preventive care refers to lifestyle choices and visits with your health care provider that can promote health and wellness. What does preventive care include?  A yearly physical exam. This is also called an annual well check.  Dental exams once or twice a year.  Routine eye exams. Ask your health care provider how often you should have your eyes checked.  Personal lifestyle choices, including: ? Daily care of your teeth and gums. ? Regular physical activity. ? Eating a healthy diet. ? Avoiding tobacco and drug use. ? Limiting alcohol use. ? Practicing safe sex. ? Taking low-dose aspirin every day. ? Taking vitamin and mineral supplements as recommended by your health care provider. What happens during an annual well check? The services and screenings done by your health care provider during your annual well check will depend on your age, overall health, lifestyle risk factors, and family history of disease. Counseling Your health care provider may ask you questions about your:  Alcohol use.  Tobacco use.  Drug use.  Emotional well-being.  Home and relationship well-being.  Sexual activity.  Eating habits.  History of falls.  Memory and ability to understand (cognition).  Work and work Statistician.  Reproductive health.  Screening You may have the following tests or measurements:  Height, weight, and BMI.  Blood  pressure.  Lipid and cholesterol levels. These may be checked every 5 years, or more frequently if you are over 25 years old.  Skin check.  Lung cancer screening. You may have this screening every year starting at age 66 if you have a 30-pack-year history of smoking and currently smoke or have quit within the past 15 years.  Fecal occult blood test (FOBT) of the stool. You may have this test every year starting at age 66.  Flexible sigmoidoscopy or colonoscopy. You may have a sigmoidoscopy every 5 years or a colonoscopy every 10 years starting at age 66.  Hepatitis C blood test.  Hepatitis B blood test.  Sexually transmitted disease (STD) testing.  Diabetes screening. This is done by checking your blood sugar (glucose) after you have not eaten for a while (fasting). You may have this done every 1-3 years.  Bone density scan. This is done to screen for osteoporosis. You may have this done starting at age 40.  Mammogram. This may be done every 1-2 years. Talk to your health care provider about how often you should have regular mammograms.  Talk with your health care provider about your test results, treatment options, and if necessary, the need for more tests. Vaccines Your health care provider may recommend certain vaccines, such as:  Influenza vaccine. This is recommended every year.  Tetanus, diphtheria, and acellular pertussis (Tdap, Td) vaccine. You may need a Td booster every 10 years.  Varicella vaccine. You may need this if you have not been vaccinated.  Zoster vaccine. You may need this after age 66.  Measles,  mumps, and rubella (MMR) vaccine. You may need at least one dose of MMR if you were born in 1957 or later. You may also need a second dose.  Pneumococcal 13-valent conjugate (PCV13) vaccine. One dose is recommended after age 66.  Pneumococcal polysaccharide (PPSV23) vaccine. One dose is recommended after age 66.  Meningococcal vaccine. You may need this if you  have certain conditions.  Hepatitis A vaccine. You may need this if you have certain conditions or if you travel or work in places where you may be exposed to hepatitis A.  Hepatitis B vaccine. You may need this if you have certain conditions or if you travel or work in places where you may be exposed to hepatitis B.  Haemophilus influenzae type b (Hib) vaccine. You may need this if you have certain conditions.  Talk to your health care provider about which screenings and vaccines you need and how often you need them. This information is not intended to replace advice given to you by your health care provider. Make sure you discuss any questions you have with your health care provider. Document Released: 04/04/2015 Document Revised: 11/26/2015 Document Reviewed: 01/07/2015 Elsevier Interactive Patient Education  [employer]. .
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
## 2017-06-15 ENCOUNTER — Telehealth: Payer: Self-pay

## 2017-06-15 ENCOUNTER — Other Ambulatory Visit: Payer: Self-pay

## 2017-06-15 ENCOUNTER — Other Ambulatory Visit (INDEPENDENT_AMBULATORY_CARE_PROVIDER_SITE_OTHER): Payer: Medicare Other

## 2017-06-15 DIAGNOSIS — R739 Hyperglycemia, unspecified: Secondary | ICD-10-CM | POA: Diagnosis not present

## 2017-06-15 DIAGNOSIS — R945 Abnormal results of liver function studies: Principal | ICD-10-CM

## 2017-06-15 DIAGNOSIS — R7989 Other specified abnormal findings of blood chemistry: Secondary | ICD-10-CM

## 2017-06-15 LAB — HEMOGLOBIN A1C: Hgb A1c MFr Bld: 6 % (ref 4.6–6.5)

## 2017-06-15 NOTE — Telephone Encounter (Signed)
Called patient and let her know that all of the results are not back yet and once they are complete she should be able to see them on her MyChart and we can call if there are any changes that need to be discussed.  Copied from [place] [phone]. Topic: General - Other >> Jun 15, 2017  9:17 AM Oneta Rack wrote:  Relation to pt: self Call back number:[phone]   Reason for call:  Patient unable to access my chart at this time, patient inquiring about lab results , please advise  >> Jun 15, 2017  9:32 AM Oneta Rack wrote:  Relation to pt: self Call back number:[phone]   Reason for call:  Patient unable to access my chart at this time, patient inquiring about lab results , please advise

## 2017-06-21 ENCOUNTER — Encounter: Payer: Self-pay | Admitting: Physician Assistant

## 2017-06-21 DIAGNOSIS — E041 Nontoxic single thyroid nodule: Secondary | ICD-10-CM | POA: Diagnosis not present

## 2017-06-21 DIAGNOSIS — E042 Nontoxic multinodular goiter: Secondary | ICD-10-CM | POA: Diagnosis not present

## 2017-06-22 ENCOUNTER — Telehealth: Payer: Self-pay | Admitting: Physician Assistant

## 2017-06-22 NOTE — Telephone Encounter (Signed)
Called patient and gave her the [place] results. Patient thanked me and stated that she will be here on the 10th to have her labs drawn again.

## 2017-06-22 NOTE — Telephone Encounter (Signed)
Received thyroid Ultrasound results via fax. Will abstract report into chart.  Please inform patient of the results noted below.  Report reads  (1) Normal sized thyroid with small nodules. None meet criteria for biopsy.  (2) Recommend 1 year follow-up surveillance [place] to confirm stability.  I have placed a reminder to call her in 1 year to set up surveillance ultrasound.

## 2017-06-29 ENCOUNTER — Other Ambulatory Visit (INDEPENDENT_AMBULATORY_CARE_PROVIDER_SITE_OTHER): Payer: Medicare Other

## 2017-06-29 DIAGNOSIS — R945 Abnormal results of liver function studies: Secondary | ICD-10-CM

## 2017-06-29 DIAGNOSIS — R7989 Other specified abnormal findings of blood chemistry: Secondary | ICD-10-CM

## 2017-06-29 LAB — HEPATIC FUNCTION PANEL
ALT: 42 U/L — AB (ref 0–35)
AST: 30 U/L (ref 0–37)
Albumin: 4.4 g/dL (ref 3.5–5.2)
Alkaline Phosphatase: 68 U/L (ref 39–117)
BILIRUBIN DIRECT: 0.1 mg/dL (ref 0.0–0.3)
BILIRUBIN TOTAL: 0.9 mg/dL (ref 0.2–1.2)
Total Protein: 7.2 g/dL (ref 6.0–8.3)

## 2017-06-30 ENCOUNTER — Other Ambulatory Visit: Payer: Self-pay | Admitting: Emergency Medicine

## 2017-06-30 DIAGNOSIS — R945 Abnormal results of liver function studies: Principal | ICD-10-CM

## 2017-06-30 DIAGNOSIS — R7989 Other specified abnormal findings of blood chemistry: Secondary | ICD-10-CM

## 2017-06-30 NOTE — Progress Notes (Signed)
LFT improving. Suspect transient elevation. Check once more in 1 month.

## 2017-08-01 ENCOUNTER — Other Ambulatory Visit (INDEPENDENT_AMBULATORY_CARE_PROVIDER_SITE_OTHER): Payer: Medicare Other

## 2017-08-01 DIAGNOSIS — R7989 Other specified abnormal findings of blood chemistry: Secondary | ICD-10-CM

## 2017-08-01 DIAGNOSIS — R945 Abnormal results of liver function studies: Secondary | ICD-10-CM | POA: Diagnosis not present

## 2017-08-01 LAB — HEPATIC FUNCTION PANEL
ALBUMIN: 4.4 g/dL (ref 3.5–5.2)
ALT: 31 U/L (ref 0–35)
AST: 21 U/L (ref 0–37)
Alkaline Phosphatase: 76 U/L (ref 39–117)
BILIRUBIN TOTAL: 0.5 mg/dL (ref 0.2–1.2)
Bilirubin, Direct: 0.1 mg/dL (ref 0.0–0.3)
Total Protein: 7.2 g/dL (ref 6.0–8.3)

## 2017-09-07 DIAGNOSIS — D485 Neoplasm of uncertain behavior of skin: Secondary | ICD-10-CM | POA: Diagnosis not present

## 2017-09-07 DIAGNOSIS — D225 Melanocytic nevi of trunk: Secondary | ICD-10-CM | POA: Diagnosis not present

## 2017-09-07 DIAGNOSIS — L821 Other seborrheic keratosis: Secondary | ICD-10-CM | POA: Diagnosis not present

## 2017-09-07 DIAGNOSIS — L719 Rosacea, unspecified: Secondary | ICD-10-CM | POA: Diagnosis not present

## 2017-09-07 DIAGNOSIS — L570 Actinic keratosis: Secondary | ICD-10-CM | POA: Diagnosis not present

## 2017-10-21 ENCOUNTER — Other Ambulatory Visit: Payer: Self-pay | Admitting: Physician Assistant

## 2017-10-21 DIAGNOSIS — I7300 Raynaud's syndrome without gangrene: Secondary | ICD-10-CM

## 2017-11-23 DIAGNOSIS — L719 Rosacea, unspecified: Secondary | ICD-10-CM | POA: Diagnosis not present

## 2017-11-23 DIAGNOSIS — L821 Other seborrheic keratosis: Secondary | ICD-10-CM | POA: Diagnosis not present

## 2018-02-07 DIAGNOSIS — Z1231 Encounter for screening mammogram for malignant neoplasm of breast: Secondary | ICD-10-CM | POA: Diagnosis not present

## 2018-02-07 LAB — HM MAMMOGRAPHY

## 2018-03-03 ENCOUNTER — Encounter: Payer: Self-pay | Admitting: Physician Assistant

## 2018-04-04 ENCOUNTER — Encounter: Payer: Self-pay | Admitting: Emergency Medicine

## 2018-05-20 ENCOUNTER — Other Ambulatory Visit: Payer: Self-pay | Admitting: Physician Assistant

## 2018-05-20 DIAGNOSIS — I7300 Raynaud's syndrome without gangrene: Secondary | ICD-10-CM

## 2018-05-24 DIAGNOSIS — L819 Disorder of pigmentation, unspecified: Secondary | ICD-10-CM | POA: Diagnosis not present

## 2018-05-24 DIAGNOSIS — L570 Actinic keratosis: Secondary | ICD-10-CM | POA: Diagnosis not present

## 2018-05-24 DIAGNOSIS — L719 Rosacea, unspecified: Secondary | ICD-10-CM | POA: Diagnosis not present

## 2018-07-13 ENCOUNTER — Telehealth: Payer: Self-pay

## 2018-07-13 NOTE — Telephone Encounter (Signed)
SW pt to schedule AWV. Patient declines at this time, would prefer to wait until after COVID pandemic. Patient states she needs refill for amlodipine 2.5 mg and has been taking half tab "for a while" to get her through. Offered virtual visit for HTN f/u, patient declined stating she has enough to get her through 4 weeks if she continues to take half tablets. Patient states BP readings have been WNL, did not have readings at time of call.

## 2018-07-13 NOTE — Telephone Encounter (Signed)
She has not been seen in > 1 year so no refills of medications will be given without follow-up.

## 2018-08-02 ENCOUNTER — Telehealth: Payer: Self-pay | Admitting: Physician Assistant

## 2018-08-02 DIAGNOSIS — E041 Nontoxic single thyroid nodule: Secondary | ICD-10-CM

## 2018-08-02 NOTE — Telephone Encounter (Signed)
Please contact patient to let her know she is due for 1 year follow-up thyroid US due to prior nodule. I have postponed for a couple of weeks due to [place]. I have placed order and she will be contacted to schedule.

## 2018-08-02 NOTE — Telephone Encounter (Signed)
Advised patient that Thyroid US has been placed for recheck yearly. She will be called to schedule. She is agreeable.

## 2018-08-08 NOTE — Telephone Encounter (Signed)
Patient calling and states that she received a phone call from [facility] to get her Thyroid Ultrasound scheduled. Patient states that is not the place that she goes to get her ultrasound done. Would like the order to be changed and sent to [facility] in [place], [place]. Please advise.

## 2018-08-09 ENCOUNTER — Other Ambulatory Visit: Payer: Self-pay | Admitting: Physician Assistant

## 2018-08-09 DIAGNOSIS — E041 Nontoxic single thyroid nodule: Secondary | ICD-10-CM

## 2018-08-09 NOTE — Telephone Encounter (Signed)
Can you change order location please?

## 2018-08-09 NOTE — Telephone Encounter (Signed)
Re-ordered the Thyroid US with external location.

## 2018-08-09 NOTE — Telephone Encounter (Signed)
Ok to send order to requested location.

## 2018-08-09 NOTE — Telephone Encounter (Signed)
Can you change location on order to [facility], pt does not want to go to [place].

## 2018-08-15 DIAGNOSIS — E042 Nontoxic multinodular goiter: Secondary | ICD-10-CM | POA: Diagnosis not present

## 2018-08-15 DIAGNOSIS — E041 Nontoxic single thyroid nodule: Secondary | ICD-10-CM | POA: Diagnosis not present

## 2018-08-28 ENCOUNTER — Telehealth: Payer: Self-pay | Admitting: Physician Assistant

## 2018-08-28 NOTE — Telephone Encounter (Signed)
Results are in. Overall everything looks great. One of the prior nodules of concern is unchanged from last testing, at 0.6 cm. No recommended biopsy or repeat US. The other nodule of concern is no longer seen on [place]. The radiologist reviewed [place] images from last year and feels the area of concern was a pseudo nodule (not a real thyroid nodule). At present time no further imaging needed.

## 2018-08-28 NOTE — Telephone Encounter (Signed)
Waiting on results of Thyroid ultrasound to be faxed over. Results in your folder for review. Please advise and I will call patient with results.

## 2018-08-28 NOTE — Telephone Encounter (Signed)
[place], A [facility] of [facility], [place] at  [address], [place], [place] [address]  Phone: [phone] Requested results for repeat Thyroid Ultrasound. Medical Records should be sending over the results for review.

## 2018-08-28 NOTE — Telephone Encounter (Signed)
Pt states that she had an US done on 5/26 and is still waiting for [place] to call her with results. Looks like pt wanting to go to [facility] diagnostic imaging, was not sure if we have received the results yet. Please advise

## 2018-08-29 NOTE — Telephone Encounter (Signed)
Advised patient of [place] results. No further imaging is needed at this time.

## 2018-10-05 ENCOUNTER — Other Ambulatory Visit: Payer: Self-pay

## 2018-10-05 ENCOUNTER — Ambulatory Visit (INDEPENDENT_AMBULATORY_CARE_PROVIDER_SITE_OTHER): Payer: Medicare Other | Admitting: Physician Assistant

## 2018-10-05 ENCOUNTER — Encounter: Payer: Self-pay | Admitting: Physician Assistant

## 2018-10-05 DIAGNOSIS — I7300 Raynaud's syndrome without gangrene: Secondary | ICD-10-CM

## 2018-10-05 DIAGNOSIS — E7800 Pure hypercholesterolemia, unspecified: Secondary | ICD-10-CM | POA: Diagnosis not present

## 2018-10-05 DIAGNOSIS — K219 Gastro-esophageal reflux disease without esophagitis: Secondary | ICD-10-CM

## 2018-10-05 MED ORDER — PANTOPRAZOLE SODIUM 40 MG PO TBEC: 40.0000 mg | DELAYED_RELEASE_TABLET | Freq: Every day | ORAL | 3 refills | Status: DC

## 2018-10-05 MED ORDER — AMLODIPINE BESYLATE 2.5 MG PO TABS: 2.5000 mg | ORAL_TABLET | Freq: Every day | ORAL | 1 refills | Status: DC

## 2018-10-05 NOTE — Patient Instructions (Signed)
We will see you next Monday for your lab appointment.  Keep up the great work with diet and exercise!  Try to follow the dietary recommendations below. Please start the Protonix (Pantoprazole) daily over the next 2 weeks. Start a daily probiotic (Digestive Advantage, Cultrelle and Align are good options). You can ask your pharmacist as well for their recommendation.  Follow-up with me in 2 week for reassessment of reflux.    Food Choices for Gastroesophageal Reflux Disease, Adult When you have gastroesophageal reflux disease (GERD), the foods you eat and your eating habits are very important. Choosing the right foods can help ease your discomfort. Think about working with a nutrition specialist (dietitian) to help you make good choices. What are tips for following this plan?  Meals  Choose healthy foods that are low in fat, such as fruits, vegetables, whole grains, low-fat dairy products, and lean meat, fish, and poultry.  Eat small meals often instead of 3 large meals a day. Eat your meals slowly, and in a place where you are relaxed. Avoid bending over or lying down until 2-3 hours after eating.  Avoid eating meals 2-3 hours before bed.  Avoid drinking a lot of liquid with meals.  Cook foods using methods other than frying. Bake, grill, or broil food instead.  Avoid or limit: ? Chocolate. ? Peppermint or spearmint. ? Alcohol. ? Pepper. ? Black and decaffeinated coffee. ? Black and decaffeinated tea. ? Bubbly (carbonated) soft drinks. ? Caffeinated energy drinks and soft drinks.  Limit high-fat foods such as: ? Fatty meat or fried foods. ? Whole milk, cream, butter, or ice cream. ? Nuts and nut butters. ? Pastries, donuts, and sweets made with butter or shortening.  Avoid foods that cause symptoms. These foods may be different for everyone. Common foods that cause symptoms include: ? Tomatoes. ? Oranges, lemons, and limes. ? Peppers. ? Spicy food. ? Onions and garlic.  ? Vinegar. Lifestyle  Maintain a healthy weight. Ask your doctor what weight is healthy for you. If you need to lose weight, work with your doctor to do so safely.  Exercise for at least 30 minutes for 5 or more days each week, or as told by your doctor.  Wear loose-fitting clothes.  Do not smoke. If you need help quitting, ask your doctor.  Sleep with the head of your bed higher than your feet. Use a wedge under the mattress or blocks under the bed frame to raise the head of the bed. Summary  When you have gastroesophageal reflux disease (GERD), food and lifestyle choices are very important in easing your symptoms.  Eat small meals often instead of 3 large meals a day. Eat your meals slowly, and in a place where you are relaxed.  Limit high-fat foods such as fatty meat or fried foods.  Avoid bending over or lying down until 2-3 hours after eating.  Avoid peppermint and spearmint, caffeine, alcohol, and chocolate. This information is not intended to replace advice given to you by your health care provider. Make sure you discuss any questions you have with your health care provider. Document Released: 09/07/2011 Document Revised: 06/29/2018 Document Reviewed: 04/13/2016 Elsevier Patient Education  2020 [employer].

## 2018-10-05 NOTE — Progress Notes (Signed)
I have discussed the procedure for the virtual visit with the patient who has given consent to proceed with assessment and treatment.   Janne Faulk S Raquel Racey, CMA     

## 2018-10-05 NOTE — Progress Notes (Signed)
Virtual Visit via Video   I connected with patient on 10/05/18 at  9:00 AM EDT by a video enabled telemedicine application and verified that I am speaking with the correct person using two identifiers.  Location patient: Home Location provider: [facility], Office Persons participating in the virtual visit: Patient, Provider, [place] (Patina Moore)  I discussed the limitations of evaluation and management by telemedicine and the availability of in person appointments. The patient expressed understanding and agreed to proceed.  Subjective:   HPI:   Patient presents via Doxy[employer]me today for follow-up of cholesterol.  Patient also noting issues with heartburn that she would like to address.  Patient with history of borderline cholesterol.  Since last check, patient endorses she has been exercising on her elliptical for 20 to 30 minutes, 6-7 days per week.  Notes eating more vegetables and limiting fried or fatty foods. Avoids processed foods. Has noted a 5 pound weight loss. Is taking Cholesterol Care once daily.   Lab Results  Component Value Date   LDLCALC 118 (H) 06/14/2017   In regards to heartburn, patient endorses symptoms after eating.  Notes it does not matter what she eats or drinks, heartburn occurs daily, worse at night. Denies late night eating. Denies change in intake of citrus foods. Denies heavy tomato use. Notes globus. Denies abdominal pain, nausea, vomiting. Denies blood in the stool. Denies NSAID use. Denies alcohol consumption. Has taken a TUMS on occasion.   ROS:   See pertinent positives and negatives per HPI.  Patient Active Problem List   Diagnosis Date Noted  . Interstitial cystitis 08/17/2016  . Osteopenia 08/17/2016  . Raynaud's phenomenon without gangrene 07/18/2016  . Asymptomatic postmenopausal estrogen deficiency 07/18/2016  . Degeneration of intervertebral disc, site unspecified 02/23/2012    Social History   Tobacco Use  . Smoking status:  Never Smoker  . Smokeless tobacco: Never Used  Substance Use Topics  . Alcohol use: No    Current Outpatient Medications:  .  Acetylcarnitine HCl (ACETYL L-CARNITINE) 500 MG CAPS, Take 1 capsule by mouth daily., Disp: , Rfl:  .  amLODipine (NORVASC) 2.5 MG tablet, TAKE 1 TABLET BY MOUTH EVERY DAY, Disp: 90 tablet, Rfl: 1 .  Calcium Citrate-Vitamin D (CITRACAL/VITAMIN D) 250-200 MG-UNIT TABS, Take 1 tablet by mouth daily., Disp: 120 each, Rfl: 0 .  Cinnamon (HM CINNAMON) 500 MG capsule, Take 500 mg by mouth daily., Disp: , Rfl:  .  Collagen 500 MG CAPS, Take 1 capsule by mouth daily., Disp: , Rfl:  .  doxycycline (ADOXA) 50 MG tablet, Take 50 mg by mouth daily. , Disp: , Rfl:  .  Multiple Vitamins-Minerals (EYE SUPPORT) TABS, Take 1 tablet by mouth 2 (two) times daily., Disp: , Rfl:   No Known Allergies  Objective:   There were no vitals taken for this visit.  Patient is well-developed, well-nourished in no acute distress.  Resting comfortably at home.  Head is normocephalic, atraumatic.  No labored breathing.  Speech is clear and coherent with logical contest.  Patient is alert and oriented at baseline.   Assessment and Plan:   1. Raynaud's phenomenon without gangrene Med refilled today. Stable. - amLODipine (NORVASC) 2.5 MG tablet; Take 1 tablet (2.5 mg total) by mouth daily.  Dispense: 90 tablet; Refill: 1  2. Gastroesophageal reflux disease without esophagitis Without alarm signs/symptoms. Rx Protonix for 2 week trial GERD diet reviewed. Handout sent to MyChart with instructions. Follow-up 2 weeks -- video or in-office.   3.  Elevated LDL cholesterol level Made significant changes to diet and exercise for which she is to be commended. Continue Cholesterol Care OTC. Lab appt for fasting lipids has been scheduled.     Leeanne Rio, PA-C 10/05/2018

## 2018-10-09 ENCOUNTER — Ambulatory Visit (INDEPENDENT_AMBULATORY_CARE_PROVIDER_SITE_OTHER): Payer: Medicare Other

## 2018-10-09 ENCOUNTER — Other Ambulatory Visit: Payer: Self-pay

## 2018-10-09 DIAGNOSIS — E7800 Pure hypercholesterolemia, unspecified: Secondary | ICD-10-CM | POA: Diagnosis not present

## 2018-10-09 LAB — LIPID PANEL
Cholesterol: 210 mg/dL — ABNORMAL HIGH (ref 0–200)
HDL: 56.8 mg/dL (ref >39.00)
LDL Cholesterol: 124 mg/dL — ABNORMAL HIGH (ref 0–99)
NonHDL: 153.56
Total CHOL/HDL Ratio: 4
Triglycerides: 148 mg/dL (ref 0.0–149.0)
VLDL: 29.6 mg/dL (ref 0.0–40.0)

## 2018-10-19 ENCOUNTER — Other Ambulatory Visit: Payer: Self-pay

## 2018-10-30 DIAGNOSIS — L570 Actinic keratosis: Secondary | ICD-10-CM | POA: Diagnosis not present

## 2018-10-30 DIAGNOSIS — L719 Rosacea, unspecified: Secondary | ICD-10-CM | POA: Diagnosis not present

## 2018-12-21 DIAGNOSIS — Z23 Encounter for immunization: Secondary | ICD-10-CM | POA: Diagnosis not present

## 2019-02-07 ENCOUNTER — Telehealth: Payer: Self-pay | Admitting: Physician Assistant

## 2019-02-07 NOTE — Telephone Encounter (Signed)
Patient would need appt for that.

## 2019-02-07 NOTE — Telephone Encounter (Signed)
Pt is requesting to have a full lab panel done. No orders are in the system

## 2019-02-07 NOTE — Telephone Encounter (Signed)
Pt scheduled  

## 2019-02-14 ENCOUNTER — Encounter: Payer: Self-pay | Admitting: Physician Assistant

## 2019-02-14 ENCOUNTER — Other Ambulatory Visit: Payer: Self-pay

## 2019-02-14 ENCOUNTER — Ambulatory Visit (INDEPENDENT_AMBULATORY_CARE_PROVIDER_SITE_OTHER): Payer: Medicare Other

## 2019-02-14 ENCOUNTER — Ambulatory Visit (INDEPENDENT_AMBULATORY_CARE_PROVIDER_SITE_OTHER): Payer: Medicare Other | Admitting: Physician Assistant

## 2019-02-14 VITALS — BP 136/74 | HR 76 | Temp 99.4°F | Ht 64.0 in | Wt 143.2 lb

## 2019-02-14 DIAGNOSIS — I7300 Raynaud's syndrome without gangrene: Secondary | ICD-10-CM | POA: Diagnosis not present

## 2019-02-14 DIAGNOSIS — Z0000 Encounter for general adult medical examination without abnormal findings: Secondary | ICD-10-CM

## 2019-02-14 DIAGNOSIS — L2082 Flexural eczema: Secondary | ICD-10-CM | POA: Diagnosis not present

## 2019-02-14 DIAGNOSIS — E785 Hyperlipidemia, unspecified: Secondary | ICD-10-CM | POA: Diagnosis not present

## 2019-02-14 DIAGNOSIS — M8589 Other specified disorders of bone density and structure, multiple sites: Secondary | ICD-10-CM | POA: Diagnosis not present

## 2019-02-14 DIAGNOSIS — Z23 Encounter for immunization: Secondary | ICD-10-CM

## 2019-02-14 MED ORDER — TRIAMCINOLONE ACETONIDE 0.1 % EX CREA: 1.0000 "application " | TOPICAL_CREAM | Freq: Two times a day (BID) | CUTANEOUS | 0 refills | Status: DC
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
## 2019-02-14 NOTE — Patient Instructions (Signed)
Please go to the lab today for blood work.  I will call you with your results. We will alter treatment regimen(s) if indicated by your results.   Please start the Triamcinolone cream to the neck twice daily for 1-2 weeks, then as needed. Keep a good moisturizing lotion on the area. Let me know if the area is not improving.    Preventive Care 45 Years and Older, Female Preventive care refers to lifestyle choices and visits with your health care provider that can promote health and wellness. This includes:  A yearly physical exam. This is also called an annual well check.  Regular dental and eye exams.  Immunizations.  Screening for certain conditions.  Healthy lifestyle choices, such as diet and exercise. What can I expect for my preventive care visit? Physical exam Your health care provider will check:  Height and weight. These may be used to calculate body mass index (BMI), which is a measurement that tells if you are at a healthy weight.  Heart rate and blood pressure.  Your skin for abnormal spots. Counseling Your health care provider may ask you questions about:  Alcohol, tobacco, and drug use.  Emotional well-being.  Home and relationship well-being.  Sexual activity.  Eating habits.  History of falls.  Memory and ability to understand (cognition).  Work and work Statistician.  Pregnancy and menstrual history. What immunizations do I need?  Influenza (flu) vaccine  This is recommended every year. Tetanus, diphtheria, and pertussis (Tdap) vaccine  You may need a Td booster every 10 years. Varicella (chickenpox) vaccine  You may need this vaccine if you have not already been vaccinated. Zoster (shingles) vaccine  You may need this after age 64. Pneumococcal conjugate (PCV13) vaccine  One dose is recommended after age 70. Pneumococcal polysaccharide (PPSV23) vaccine  One dose is recommended after age 37. Measles, mumps, and rubella (MMR) vaccine   You may need at least one dose of MMR if you were born in 1957 or later. You may also need a second dose. Meningococcal conjugate (MenACWY) vaccine  You may need this if you have certain conditions. Hepatitis A vaccine  You may need this if you have certain conditions or if you travel or work in places where you may be exposed to hepatitis A. Hepatitis B vaccine  You may need this if you have certain conditions or if you travel or work in places where you may be exposed to hepatitis B. Haemophilus influenzae type b (Hib) vaccine  You may need this if you have certain conditions. You may receive vaccines as individual doses or as more than one vaccine together in one shot (combination vaccines). Talk with your health care provider about the risks and benefits of combination vaccines. What tests do I need? Blood tests  Lipid and cholesterol levels. These may be checked every 5 years, or more frequently depending on your overall health.  Hepatitis C test.  Hepatitis B test. Screening  Lung cancer screening. You may have this screening every year starting at age 93 if you have a 30-pack-year history of smoking and currently smoke or have quit within the past 15 years.  Colorectal cancer screening. All adults should have this screening starting at age 92 and continuing until age 69. Your health care provider may recommend screening at age 44 if you are at increased risk. You will have tests every 1-10 years, depending on your results and the type of screening test.  Diabetes screening. This is done by checking  your blood sugar (glucose) after you have not eaten for a while (fasting). You may have this done every 1-3 years.  Mammogram. This may be done every 1-2 years. Talk with your health care provider about how often you should have regular mammograms.  BRCA-related cancer screening. This may be done if you have a family history of breast, ovarian, tubal, or peritoneal cancers. Other  tests  Sexually transmitted disease (STD) testing.  Bone density scan. This is done to screen for osteoporosis. You may have this done starting at age 53. Follow these instructions at home: Eating and drinking  Eat a diet that includes fresh fruits and vegetables, whole grains, lean protein, and low-fat dairy products. Limit your intake of foods with high amounts of sugar, saturated fats, and salt.  Take vitamin and mineral supplements as recommended by your health care provider.  Do not drink alcohol if your health care provider tells you not to drink.  If you drink alcohol: ? Limit how much you have to 0-1 drink a day. ? Be aware of how much alcohol is in your drink. In the [place] one drink equals one 12 oz bottle of beer (355 mL), one 5 oz glass of wine (148 mL), or one 1 oz glass of hard liquor (44 mL). Lifestyle  Take daily care of your teeth and gums.  Stay active. Exercise for at least 30 minutes on 5 or more days each week.  Do not use any products that contain nicotine or tobacco, such as cigarettes, e-cigarettes, and chewing tobacco. If you need help quitting, ask your health care provider.  If you are sexually active, practice safe sex. Use a condom or other form of protection in order to prevent STIs (sexually transmitted infections).  Talk with your health care provider about taking a low-dose aspirin or statin. What's next?  Go to your health care provider once a year for a well check visit.  Ask your health care provider how often you should have your eyes and teeth checked.  Stay up to date on all vaccines. This information is not intended to replace advice given to you by your health care provider. Make sure you discuss any questions you have with your health care provider. Document Released: 04/04/2015 Document Revised: 03/02/2018 Document Reviewed: 03/02/2018 Elsevier Patient Education  2020 [employer].
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
## 2019-02-14 NOTE — Progress Notes (Signed)
LPN MWV note reviewed.  Oluwatoni Rotunno Cody Dyllin Gulley, PA-C  

## 2019-02-14 NOTE — Patient Instructions (Addendum)
Debra Khan , Thank you for taking time to come for your Medicare Wellness Visit. I appreciate your ongoing commitment to your health goals. Please review the following plan we discussed and let me know if I can assist you in the future.   Screening recommendations/referrals: Colorectal Screening: up to date; last 09/28/12 with Dr. Anthony Sar  Mammogram: up to date; last 02/07/18 Bone Density: up to date; last 09/13/16  Vision and Dental Exams: Recommended annual ophthalmology exams for early detection of glaucoma and other disorders of the eye Recommended annual dental exams for proper oral hygiene  Vaccinations: Influenza vaccine: completed  Pneumococcal vaccine: recommended Tdap vaccine: recommended; Please call your insurance company to determine your out of pocket expense. You may also receive this vaccine at your local pharmacy or Health Dept. Shingles vaccine: Please call your insurance company to determine your out of pocket expense for the Shingrix vaccine. You may receive this vaccine at your local pharmacy.  Advanced directives: Please bring a copy of your POA (Power of Attorney) and/or Living Will to your next appointment.  Goals: Recommend to drink at least 6-8 8oz glasses of water per day and consume a balanced diet rich in fresh fruits and vegetables.   Next appointment: Please schedule your Annual Wellness Visit with your Nurse Health Advisor in one year.  Preventive Care 25 Years and Older, Female Preventive care refers to lifestyle choices and visits with your health care provider that can promote health and wellness. What does preventive care include?  A yearly physical exam. This is also called an annual well check.  Dental exams once or twice a year.  Routine eye exams. Ask your health care provider how often you should have your eyes checked.  Personal lifestyle choices, including:  Daily care of your teeth and gums.  Regular physical activity.  Eating a  healthy diet.  Avoiding tobacco and drug use.  Limiting alcohol use.  Practicing safe sex.  Taking low-dose aspirin every day if recommended by your health care provider.  Taking vitamin and mineral supplements as recommended by your health care provider. What happens during an annual well check? The services and screenings done by your health care provider during your annual well check will depend on your age, overall health, lifestyle risk factors, and family history of disease. Counseling  Your health care provider may ask you questions about your:  Alcohol use.  Tobacco use.  Drug use.  Emotional well-being.  Home and relationship well-being.  Sexual activity.  Eating habits.  History of falls.  Memory and ability to understand (cognition).  Work and work Statistician.  Reproductive health. Screening  You may have the following tests or measurements:  Height, weight, and BMI.  Blood pressure.  Lipid and cholesterol levels. These may be checked every 5 years, or more frequently if you are over 67 years old.  Skin check.  Lung cancer screening. You may have this screening every year starting at age 38 if you have a 30-pack-year history of smoking and currently smoke or have quit within the past 15 years.  Fecal occult blood test (FOBT) of the stool. You may have this test every year starting at age 90.  Flexible sigmoidoscopy or colonoscopy. You may have a sigmoidoscopy every 5 years or a colonoscopy every 10 years starting at age 8.  Hepatitis C blood test.  Hepatitis B blood test.  Sexually transmitted disease (STD) testing.  Diabetes screening. This is done by checking your blood sugar (glucose) after you  have not eaten for a while (fasting). You may have this done every 1-3 years.  Bone density scan. This is done to screen for osteoporosis. You may have this done starting at age 36.  Mammogram. This may be done every 1-2 years. Talk to your health  care provider about how often you should have regular mammograms. Talk with your health care provider about your test results, treatment options, and if necessary, the need for more tests. Vaccines  Your health care provider may recommend certain vaccines, such as:  Influenza vaccine. This is recommended every year.  Tetanus, diphtheria, and acellular pertussis (Tdap, Td) vaccine. You may need a Td booster every 10 years.  Zoster vaccine. You may need this after age 79.  Pneumococcal 13-valent conjugate (PCV13) vaccine. One dose is recommended after age 14.  Pneumococcal polysaccharide (PPSV23) vaccine. One dose is recommended after age 58. Talk to your health care provider about which screenings and vaccines you need and how often you need them. This information is not intended to replace advice given to you by your health care provider. Make sure you discuss any questions you have with your health care provider. Document Released: 04/04/2015 Document Revised: 11/26/2015 Document Reviewed: 01/07/2015 Elsevier Interactive Patient Education  2017 [employer] Prevention in the Home Falls can cause injuries. They can happen to people of all ages. There are many things you can do to make your home safe and to help prevent falls. What can I do on the outside of my home?  Regularly fix the edges of walkways and driveways and fix any cracks.  Remove anything that might make you trip as you walk through a door, such as a raised step or threshold.  Trim any bushes or trees on the path to your home.  Use bright outdoor lighting.  Clear any walking paths of anything that might make someone trip, such as rocks or tools.  Regularly check to see if handrails are loose or broken. Make sure that both sides of any steps have handrails.  Any raised decks and porches should have guardrails on the edges.  Have any leaves, snow, or ice cleared regularly.  Use sand or salt on walking paths  during winter.  Clean up any spills in your garage right away. This includes oil or grease spills. What can I do in the bathroom?  Use night lights.  Install grab bars by the toilet and in the tub and shower. Do not use towel bars as grab bars.  Use non-skid mats or decals in the tub or shower.  If you need to sit down in the shower, use a plastic, non-slip stool.  Keep the floor dry. Clean up any water that spills on the floor as soon as it happens.  Remove soap buildup in the tub or shower regularly.  Attach bath mats securely with double-sided non-slip rug tape.  Do not have throw rugs and other things on the floor that can make you trip. What can I do in the bedroom?  Use night lights.  Make sure that you have a light by your bed that is easy to reach.  Do not use any sheets or blankets that are too big for your bed. They should not hang down onto the floor.  Have a firm chair that has side arms. You can use this for support while you get dressed.  Do not have throw rugs and other things on the floor that can make you trip. What  can I do in the kitchen?  Clean up any spills right away.  Avoid walking on wet floors.  Keep items that you use a lot in easy-to-reach places.  If you need to reach something above you, use a strong step stool that has a grab bar.  Keep electrical cords out of the way.  Do not use floor polish or wax that makes floors slippery. If you must use wax, use non-skid floor wax.  Do not have throw rugs and other things on the floor that can make you trip. What can I do with my stairs?  Do not leave any items on the stairs.  Make sure that there are handrails on both sides of the stairs and use them. Fix handrails that are broken or loose. Make sure that handrails are as long as the stairways.  Check any carpeting to make sure that it is firmly attached to the stairs. Fix any carpet that is loose or worn.  Avoid having throw rugs at the top  or bottom of the stairs. If you do have throw rugs, attach them to the floor with carpet tape.  Make sure that you have a light switch at the top of the stairs and the bottom of the stairs. If you do not have them, ask someone to add them for you. What else can I do to help prevent falls?  Wear shoes that:  Do not have high heels.  Have rubber bottoms.  Are comfortable and fit you well.  Are closed at the toe. Do not wear sandals.  If you use a stepladder:  Make sure that it is fully opened. Do not climb a closed stepladder.  Make sure that both sides of the stepladder are locked into place.  Ask someone to hold it for you, if possible.  Clearly mark and make sure that you can see:  Any grab bars or handrails.  First and last steps.  Where the edge of each step is.  Use tools that help you move around (mobility aids) if they are needed. These include:  Canes.  Walkers.  Scooters.  Crutches.  Turn on the lights when you go into a dark area. Replace any light bulbs as soon as they burn out.  Set up your furniture so you have a clear path. Avoid moving your furniture around.  If any of your floors are uneven, fix them.  If there are any pets around you, be aware of where they are.  Review your medicines with your doctor. Some medicines can make you feel dizzy. This can increase your chance of falling. Ask your doctor what other things that you can do to help prevent falls. This information is not intended to replace advice given to you by your health care provider. Make sure you discuss any questions you have with your health care provider. Document Released: 01/02/2009 Document Revised: 08/14/2015 Document Reviewed: 04/12/2014 Elsevier Interactive Patient Education  2017 [employer].

## 2019-02-15 LAB — LIPID PANEL
Cholesterol: 212 mg/dL — ABNORMAL HIGH (ref <200)
HDL: 61 mg/dL (ref >=50)
LDL Cholesterol (Calc): 128 mg/dL (calc) — ABNORMAL HIGH
Non-HDL Cholesterol (Calc): 151 mg/dL (calc) — ABNORMAL HIGH (ref <130)
Total CHOL/HDL Ratio: 3.5 (calc) (ref <5.0)
Triglycerides: 119 mg/dL (ref <150)

## 2019-02-15 LAB — COMPREHENSIVE METABOLIC PANEL
AG Ratio: 1.6 (calc) (ref 1.0–2.5)
ALT: 51 U/L — ABNORMAL HIGH (ref 6–29)
AST: 39 U/L — ABNORMAL HIGH (ref 10–35)
Albumin: 4.7 g/dL (ref 3.6–5.1)
Alkaline phosphatase (APISO): 83 U/L (ref 37–153)
BUN: 15 mg/dL (ref 7–25)
CO2: 24 mmol/L (ref 20–32)
Calcium: 10.2 mg/dL (ref 8.6–10.4)
Chloride: 104 mmol/L (ref 98–110)
Creat: 0.73 mg/dL (ref 0.50–0.99)
Globulin: 2.9 g/dL (calc) (ref 1.9–3.7)
Glucose, Bld: 90 mg/dL (ref 65–99)
Potassium: 4.3 mmol/L (ref 3.5–5.3)
Sodium: 142 mmol/L (ref 135–146)
Total Bilirubin: 1 mg/dL (ref 0.2–1.2)
Total Protein: 7.6 g/dL (ref 6.1–8.1)

## 2019-02-15 LAB — EXTRA LAV TOP TUBE

## 2019-02-21 ENCOUNTER — Other Ambulatory Visit: Payer: Self-pay

## 2019-02-21 DIAGNOSIS — R7989 Other specified abnormal findings of blood chemistry: Secondary | ICD-10-CM

## 2019-03-26 ENCOUNTER — Other Ambulatory Visit: Payer: Self-pay

## 2019-03-26 ENCOUNTER — Ambulatory Visit (INDEPENDENT_AMBULATORY_CARE_PROVIDER_SITE_OTHER): Payer: Medicare Other

## 2019-03-26 DIAGNOSIS — R7989 Other specified abnormal findings of blood chemistry: Secondary | ICD-10-CM

## 2019-03-26 DIAGNOSIS — E041 Nontoxic single thyroid nodule: Secondary | ICD-10-CM

## 2019-03-26 LAB — HEPATIC FUNCTION PANEL
ALT: 36 U/L — ABNORMAL HIGH (ref 0–35)
AST: 23 U/L (ref 0–37)
Albumin: 4.5 g/dL (ref 3.5–5.2)
Alkaline Phosphatase: 84 U/L (ref 39–117)
Bilirubin, Direct: 0.1 mg/dL (ref 0.0–0.3)
Total Bilirubin: 0.7 mg/dL (ref 0.2–1.2)
Total Protein: 7.3 g/dL (ref 6.0–8.3)

## 2019-03-26 LAB — TSH: TSH: 2.21 u[IU]/mL (ref 0.35–4.50)

## 2019-03-26 NOTE — Addendum Note (Signed)
Addended by: Jodell Cipro on: 03/26/2019 09:38 AM   Modules accepted: Orders

## 2019-05-11 ENCOUNTER — Telehealth: Payer: Self-pay | Admitting: Physician Assistant

## 2019-05-11 DIAGNOSIS — I7300 Raynaud's syndrome without gangrene: Secondary | ICD-10-CM

## 2019-05-11 MED ORDER — AMLODIPINE BESYLATE 2.5 MG PO TABS: 2.5000 mg | ORAL_TABLET | Freq: Every day | ORAL | 1 refills | Status: DC

## 2019-05-11 NOTE — Telephone Encounter (Signed)
Pt called in asking for a refill on the Amlodipine, pt uses [facility] in [place]

## 2019-05-11 NOTE — Telephone Encounter (Signed)
RX sent to the patient preferred pharmacy.

## 2020-04-28 DIAGNOSIS — L719 Rosacea, unspecified: Secondary | ICD-10-CM | POA: Diagnosis not present

## 2020-04-28 DIAGNOSIS — L570 Actinic keratosis: Secondary | ICD-10-CM | POA: Diagnosis not present

## 2020-08-19 ENCOUNTER — Encounter: Payer: Self-pay | Admitting: Registered Nurse

## 2020-08-19 ENCOUNTER — Ambulatory Visit (INDEPENDENT_AMBULATORY_CARE_PROVIDER_SITE_OTHER): Payer: Medicare Other | Admitting: Registered Nurse

## 2020-08-19 ENCOUNTER — Other Ambulatory Visit: Payer: Self-pay

## 2020-08-19 VITALS — BP 125/66 | HR 78 | Temp 98.3°F | Resp 18 | Ht 64.0 in | Wt 145.4 lb

## 2020-08-19 DIAGNOSIS — R7989 Other specified abnormal findings of blood chemistry: Secondary | ICD-10-CM | POA: Diagnosis not present

## 2020-08-19 DIAGNOSIS — Z7689 Persons encountering health services in other specified circumstances: Secondary | ICD-10-CM

## 2020-08-19 DIAGNOSIS — N059 Unspecified nephritic syndrome with unspecified morphologic changes: Secondary | ICD-10-CM

## 2020-08-19 DIAGNOSIS — E785 Hyperlipidemia, unspecified: Secondary | ICD-10-CM

## 2020-08-19 DIAGNOSIS — K219 Gastro-esophageal reflux disease without esophagitis: Secondary | ICD-10-CM | POA: Diagnosis not present

## 2020-08-19 DIAGNOSIS — M8580 Other specified disorders of bone density and structure, unspecified site: Secondary | ICD-10-CM

## 2020-08-19 DIAGNOSIS — Z8639 Personal history of other endocrine, nutritional and metabolic disease: Secondary | ICD-10-CM | POA: Diagnosis not present

## 2020-08-19 DIAGNOSIS — M899 Disorder of bone, unspecified: Secondary | ICD-10-CM

## 2020-08-19 DIAGNOSIS — E041 Nontoxic single thyroid nodule: Secondary | ICD-10-CM | POA: Diagnosis not present

## 2020-08-19 MED ORDER — PANTOPRAZOLE SODIUM 40 MG PO TBEC: 40.0000 mg | DELAYED_RELEASE_TABLET | Freq: Every day | ORAL | 3 refills | Status: DC

## 2020-08-19 MED ORDER — SUCRALFATE 1 GM/10ML PO SUSP: 1.0000 g | Freq: Three times a day (TID) | ORAL | 0 refills | Status: DC

## 2020-08-19 NOTE — Patient Instructions (Addendum)
Ms. Ellithorpe -   Good to meet you.   Labs today will include: CBC, CMP, A1c, Lipid Panel, TSH, Vit D, and H Pylori antibodies. The results should be back in a day or two at the latest.  Refills will be sent for the next 6 mo  See you then, or sooner if you have any concerns  Thank you  Rich     If you have lab work done today you will be contacted with your lab results within the next 2 weeks.  If you have not heard from [place] then please contact us. The fastest way to get your results is to register for My Chart.   IF you received an x-ray today, you will receive an invoice from [facility]. Please contact [facility] at [phone] with questions or concerns regarding your invoice.   IF you received labwork today, you will receive an invoice from [place]. Please contact [facility] at [phone] with questions or concerns regarding your invoice.   Our billing staff will not be able to assist you with questions regarding bills from these companies.  You will be contacted with the lab results as soon as they are available. The fastest way to get your results is to activate your My Chart account. Instructions are located on the last page of this paperwork. If you have not heard from [place] regarding the results in 2 weeks, please contact this office.
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
## 2020-08-20 ENCOUNTER — Telehealth: Payer: Self-pay | Admitting: Registered Nurse

## 2020-08-20 ENCOUNTER — Telehealth: Payer: Self-pay

## 2020-08-20 LAB — URINALYSIS
Bilirubin Urine: NEGATIVE
Hgb urine dipstick: NEGATIVE
Ketones, ur: NEGATIVE
Leukocytes,Ua: NEGATIVE
Nitrite: NEGATIVE
Specific Gravity, Urine: >=1.03 — AB (ref 1.000–1.030)
Total Protein, Urine: NEGATIVE
Urine Glucose: NEGATIVE
Urobilinogen, UA: 0.2 (ref 0.0–1.0)
pH: 5.5 (ref 5.0–8.0)

## 2020-08-20 LAB — CBC WITH DIFFERENTIAL/PLATELET
Basophils Absolute: 0 10*3/uL (ref 0.0–0.1)
Basophils Relative: 0.6 % (ref 0.0–3.0)
Eosinophils Absolute: 0.1 10*3/uL (ref 0.0–0.7)
Eosinophils Relative: 0.8 % (ref 0.0–5.0)
HCT: 45.8 % (ref 36.0–46.0)
Hemoglobin: 15.5 g/dL — ABNORMAL HIGH (ref 12.0–15.0)
Lymphocytes Relative: 40 % (ref 12.0–46.0)
Lymphs Abs: 3.1 10*3/uL (ref 0.7–4.0)
MCHC: 33.8 g/dL (ref 30.0–36.0)
MCV: 96.9 fl (ref 78.0–100.0)
Monocytes Absolute: 0.6 10*3/uL (ref 0.1–1.0)
Monocytes Relative: 8.1 % (ref 3.0–12.0)
Neutro Abs: 3.9 10*3/uL (ref 1.4–7.7)
Neutrophils Relative %: 50.5 % (ref 43.0–77.0)
Platelets: 264 10*3/uL (ref 150.0–400.0)
RBC: 4.72 Mil/uL (ref 3.87–5.11)
RDW: 13.6 % (ref 11.5–15.5)
WBC: 7.7 10*3/uL (ref 4.0–10.5)

## 2020-08-20 LAB — COMPREHENSIVE METABOLIC PANEL
ALT: 41 U/L — ABNORMAL HIGH (ref 0–35)
AST: 36 U/L (ref 0–37)
Albumin: 4.8 g/dL (ref 3.5–5.2)
Alkaline Phosphatase: 83 U/L (ref 39–117)
BUN: 15 mg/dL (ref 6–23)
CO2: 25 mEq/L (ref 19–32)
Calcium: 10.1 mg/dL (ref 8.4–10.5)
Chloride: 104 mEq/L (ref 96–112)
Creatinine, Ser: 0.83 mg/dL (ref 0.40–1.20)
GFR: 72.04 mL/min (ref >60.00)
Glucose, Bld: 93 mg/dL (ref 70–99)
Potassium: 4.2 mEq/L (ref 3.5–5.1)
Sodium: 141 mEq/L (ref 135–145)
Total Bilirubin: 1.2 mg/dL (ref 0.2–1.2)
Total Protein: 7.7 g/dL (ref 6.0–8.3)

## 2020-08-20 LAB — LIPID PANEL
Cholesterol: 231 mg/dL — ABNORMAL HIGH (ref 0–200)
HDL: 62.2 mg/dL (ref >39.00)
LDL Cholesterol: 145 mg/dL — ABNORMAL HIGH (ref 0–99)
NonHDL: 168.87
Total CHOL/HDL Ratio: 4
Triglycerides: 121 mg/dL (ref 0.0–149.0)
VLDL: 24.2 mg/dL (ref 0.0–40.0)

## 2020-08-20 LAB — TSH: TSH: 2.32 u[IU]/mL (ref 0.35–4.50)

## 2020-08-20 LAB — HEMOGLOBIN A1C: Hgb A1c MFr Bld: 6.4 % (ref 4.6–6.5)

## 2020-08-20 LAB — VITAMIN D 25 HYDROXY (VIT D DEFICIENCY, FRACTURES): VITD: 26.96 ng/mL — ABNORMAL LOW (ref 30.00–100.00)

## 2020-08-20 MED ORDER — SUCRALFATE 1 G PO TABS: 1.0000 g | ORAL_TABLET | Freq: Four times a day (QID) | ORAL | 0 refills | Status: DC

## 2020-08-20 NOTE — Telephone Encounter (Signed)
Pt called in stating that she would like the tablet form of sucralfate 10ML not the liquid form. She is at the pharmacy now waiting. She states that she called this morning around 9am and spoke with sarah/  Please advise

## 2020-08-20 NOTE — Telephone Encounter (Signed)
error 

## 2020-08-21 LAB — H PYLORI, IGM, IGG, IGA AB
H pylori, IgM Abs: <9 units (ref 0.0–8.9)
H. pylori, IgA Abs: <9 units (ref 0.0–8.9)
H. pylori, IgG AbS: 0.68 Index Value (ref 0.00–0.79)

## 2020-08-21 NOTE — Telephone Encounter (Signed)
This was sent in yesterday

## 2020-09-23 DIAGNOSIS — H3540 Unspecified peripheral retinal degeneration: Secondary | ICD-10-CM | POA: Diagnosis not present

## 2020-10-08 DIAGNOSIS — H16223 Keratoconjunctivitis sicca, not specified as Sjogren's, bilateral: Secondary | ICD-10-CM | POA: Diagnosis not present

## 2020-10-23 DIAGNOSIS — L719 Rosacea, unspecified: Secondary | ICD-10-CM | POA: Diagnosis not present

## 2020-10-23 DIAGNOSIS — L570 Actinic keratosis: Secondary | ICD-10-CM | POA: Diagnosis not present

## 2020-12-23 DIAGNOSIS — H2513 Age-related nuclear cataract, bilateral: Secondary | ICD-10-CM | POA: Diagnosis not present

## 2021-02-18 ENCOUNTER — Encounter: Payer: Self-pay | Admitting: Registered Nurse

## 2021-02-18 ENCOUNTER — Ambulatory Visit (INDEPENDENT_AMBULATORY_CARE_PROVIDER_SITE_OTHER): Payer: Medicare Other | Admitting: Registered Nurse

## 2021-02-18 ENCOUNTER — Other Ambulatory Visit: Payer: Self-pay

## 2021-02-18 VITALS — BP 118/69 | HR 73 | Temp 98.0°F | Resp 18 | Ht 64.0 in | Wt 143.8 lb

## 2021-02-18 DIAGNOSIS — E559 Vitamin D deficiency, unspecified: Secondary | ICD-10-CM

## 2021-02-18 DIAGNOSIS — N3010 Interstitial cystitis (chronic) without hematuria: Secondary | ICD-10-CM | POA: Diagnosis not present

## 2021-02-18 DIAGNOSIS — J309 Allergic rhinitis, unspecified: Secondary | ICD-10-CM | POA: Diagnosis not present

## 2021-02-18 DIAGNOSIS — Z780 Asymptomatic menopausal state: Secondary | ICD-10-CM | POA: Diagnosis not present

## 2021-02-18 DIAGNOSIS — Z8639 Personal history of other endocrine, nutritional and metabolic disease: Secondary | ICD-10-CM

## 2021-02-18 DIAGNOSIS — N059 Unspecified nephritic syndrome with unspecified morphologic changes: Secondary | ICD-10-CM | POA: Diagnosis not present

## 2021-02-18 DIAGNOSIS — R7989 Other specified abnormal findings of blood chemistry: Secondary | ICD-10-CM | POA: Diagnosis not present

## 2021-02-18 LAB — LIPID PANEL
Cholesterol: 225 mg/dL — ABNORMAL HIGH (ref 0–200)
HDL: 69 mg/dL (ref >39.00)
LDL Cholesterol: 135 mg/dL — ABNORMAL HIGH (ref 0–99)
NonHDL: 156.38
Total CHOL/HDL Ratio: 3
Triglycerides: 106 mg/dL (ref 0.0–149.0)
VLDL: 21.2 mg/dL (ref 0.0–40.0)

## 2021-02-18 LAB — URINALYSIS, ROUTINE W REFLEX MICROSCOPIC
Bilirubin Urine: NEGATIVE
Hgb urine dipstick: NEGATIVE
Ketones, ur: NEGATIVE
Nitrite: NEGATIVE
Specific Gravity, Urine: >=1.03 — AB (ref 1.000–1.030)
Total Protein, Urine: NEGATIVE
Urine Glucose: NEGATIVE
Urobilinogen, UA: 0.2 (ref 0.0–1.0)
pH: 5 (ref 5.0–8.0)

## 2021-02-18 LAB — COMPREHENSIVE METABOLIC PANEL
ALT: 37 U/L — ABNORMAL HIGH (ref 0–35)
AST: 31 U/L (ref 0–37)
Albumin: 4.8 g/dL (ref 3.5–5.2)
Alkaline Phosphatase: 78 U/L (ref 39–117)
BUN: 13 mg/dL (ref 6–23)
CO2: 28 mEq/L (ref 19–32)
Calcium: 10.2 mg/dL (ref 8.4–10.5)
Chloride: 105 mEq/L (ref 96–112)
Creatinine, Ser: 0.86 mg/dL (ref 0.40–1.20)
GFR: 68.79 mL/min (ref >60.00)
Glucose, Bld: 111 mg/dL — ABNORMAL HIGH (ref 70–99)
Potassium: 4.5 mEq/L (ref 3.5–5.1)
Sodium: 140 mEq/L (ref 135–145)
Total Bilirubin: 1 mg/dL (ref 0.2–1.2)
Total Protein: 8 g/dL (ref 6.0–8.3)

## 2021-02-18 LAB — CBC WITH DIFFERENTIAL/PLATELET
Basophils Absolute: 0.1 10*3/uL (ref 0.0–0.1)
Basophils Relative: 0.8 % (ref 0.0–3.0)
Eosinophils Absolute: 0.1 10*3/uL (ref 0.0–0.7)
Eosinophils Relative: 1.3 % (ref 0.0–5.0)
HCT: 46.7 % — ABNORMAL HIGH (ref 36.0–46.0)
Hemoglobin: 15.6 g/dL — ABNORMAL HIGH (ref 12.0–15.0)
Lymphocytes Relative: 39.4 % (ref 12.0–46.0)
Lymphs Abs: 2.7 10*3/uL (ref 0.7–4.0)
MCHC: 33.5 g/dL (ref 30.0–36.0)
MCV: 97.5 fl (ref 78.0–100.0)
Monocytes Absolute: 0.7 10*3/uL (ref 0.1–1.0)
Monocytes Relative: 10.1 % (ref 3.0–12.0)
Neutro Abs: 3.3 10*3/uL (ref 1.4–7.7)
Neutrophils Relative %: 48.4 % (ref 43.0–77.0)
Platelets: 270 10*3/uL (ref 150.0–400.0)
RBC: 4.79 Mil/uL (ref 3.87–5.11)
RDW: 13.5 % (ref 11.5–15.5)
WBC: 6.8 10*3/uL (ref 4.0–10.5)

## 2021-02-18 LAB — TSH: TSH: 1.67 u[IU]/mL (ref 0.35–5.50)

## 2021-02-18 LAB — VITAMIN D 25 HYDROXY (VIT D DEFICIENCY, FRACTURES): VITD: 26.5 ng/mL — ABNORMAL LOW (ref 30.00–100.00)

## 2021-02-18 LAB — HEMOGLOBIN A1C: Hgb A1c MFr Bld: 6.3 % (ref 4.6–6.5)

## 2021-02-18 MED ORDER — AZELASTINE HCL 0.1 % NA SOLN: 1.0000 | Freq: Two times a day (BID) | NASAL | 12 refills | Status: DC

## 2021-02-18 MED ORDER — CETIRIZINE HCL 10 MG PO TABS: 10.0000 mg | ORAL_TABLET | Freq: Every day | ORAL | 11 refills | Status: DC
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
## 2021-02-18 NOTE — Patient Instructions (Addendum)
Ms. Daja Shuping to see you!  Keep up the great work staying healthy  [place] to use cetirizine daily, azelastine nightly.  Call me if it doesn't work!  See you in a year, sooner if concerns arise  Thank you  Rich     If you have lab work done today you will be contacted with your lab results within the next 2 weeks.  If you have not heard from [place] then please contact us. The fastest way to get your results is to register for My Chart.   IF you received an x-ray today, you will receive an invoice from [facility]. Please contact [facility] at [phone] with questions or concerns regarding your invoice.   IF you received labwork today, you will receive an invoice from [employer]. Please contact [facility] at [phone] with questions or concerns regarding your invoice.   Our billing staff will not be able to assist you with questions regarding bills from these companies.  You will be contacted with the lab results as soon as they are available. The fastest way to get your results is to activate your My Chart account. Instructions are located on the last page of this paperwork. If you have not heard from [place] regarding the results in 2 weeks, please contact this office.

## 2021-04-21 DIAGNOSIS — L719 Rosacea, unspecified: Secondary | ICD-10-CM | POA: Diagnosis not present

## 2021-04-21 DIAGNOSIS — L570 Actinic keratosis: Secondary | ICD-10-CM | POA: Diagnosis not present

## 2021-04-22 DIAGNOSIS — U071 COVID-19: Secondary | ICD-10-CM | POA: Diagnosis not present

## 2021-08-26 ENCOUNTER — Ambulatory Visit (INDEPENDENT_AMBULATORY_CARE_PROVIDER_SITE_OTHER): Payer: Medicare Other

## 2021-08-26 DIAGNOSIS — Z1231 Encounter for screening mammogram for malignant neoplasm of breast: Secondary | ICD-10-CM | POA: Diagnosis not present

## 2021-08-26 DIAGNOSIS — Z0000 Encounter for general adult medical examination without abnormal findings: Secondary | ICD-10-CM | POA: Diagnosis not present

## 2021-08-26 NOTE — Patient Instructions (Signed)
Debra Khan , Thank you for taking time to come for your Medicare Wellness Visit. I appreciate your ongoing commitment to your health goals. Please review the following plan we discussed and let me know if I can assist you in the future.   Screening recommendations/referrals: Colonoscopy: 07/17/2014 Mammogram: referral 08/26/2021 Bone Density: 09/13/2016 Recommended yearly ophthalmology/optometry visit for glaucoma screening and checkup Recommended yearly dental visit for hygiene and checkup  Vaccinations: Influenza vaccine: declined  Pneumococcal vaccine: completed  Tdap vaccine: due  Shingles vaccine: will consider     Advanced directives: none   Conditions/risks identified: none   Next appointment: none    Preventive Care 4 Years and Older, Female Preventive care refers to lifestyle choices and visits with your health care provider that can promote health and wellness. What does preventive care include? A yearly physical exam. This is also called an annual well check. Dental exams once or twice a year. Routine eye exams. Ask your health care provider how often you should have your eyes checked. Personal lifestyle choices, including: Daily care of your teeth and gums. Regular physical activity. Eating a healthy diet. Avoiding tobacco and drug use. Limiting alcohol use. Practicing safe sex. Taking low-dose aspirin every day. Taking vitamin and mineral supplements as recommended by your health care provider. What happens during an annual well check? The services and screenings done by your health care provider during your annual well check will depend on your age, overall health, lifestyle risk factors, and family history of disease. Counseling  Your health care provider may ask you questions about your: Alcohol use. Tobacco use. Drug use. Emotional well-being. Home and relationship well-being. Sexual activity. Eating habits. History of falls. Memory and ability to  understand (cognition). Work and work Statistician. Reproductive health. Screening  You may have the following tests or measurements: Height, weight, and BMI. Blood pressure. Lipid and cholesterol levels. These may be checked every 5 years, or more frequently if you are over 15 years old. Skin check. Lung cancer screening. You may have this screening every year starting at age 21 if you have a 30-pack-year history of smoking and currently smoke or have quit within the past 15 years. Fecal occult blood test (FOBT) of the stool. You may have this test every year starting at age 24. Flexible sigmoidoscopy or colonoscopy. You may have a sigmoidoscopy every 5 years or a colonoscopy every 10 years starting at age 4. Hepatitis C blood test. Hepatitis B blood test. Sexually transmitted disease (STD) testing. Diabetes screening. This is done by checking your blood sugar (glucose) after you have not eaten for a while (fasting). You may have this done every 1-3 years. Bone density scan. This is done to screen for osteoporosis. You may have this done starting at age 59. Mammogram. This may be done every 1-2 years. Talk to your health care provider about how often you should have regular mammograms. Talk with your health care provider about your test results, treatment options, and if necessary, the need for more tests. Vaccines  Your health care provider may recommend certain vaccines, such as: Influenza vaccine. This is recommended every year. Tetanus, diphtheria, and acellular pertussis (Tdap, Td) vaccine. You may need a Td booster every 10 years. Zoster vaccine. You may need this after age 29. Pneumococcal 13-valent conjugate (PCV13) vaccine. One dose is recommended after age 23. Pneumococcal polysaccharide (PPSV23) vaccine. One dose is recommended after age 7. Talk to your health care provider about which screenings and vaccines you need and  how often you need them. This information is not  intended to replace advice given to you by your health care provider. Make sure you discuss any questions you have with your health care provider. Document Released: 04/04/2015 Document Revised: 11/26/2015 Document Reviewed: 01/07/2015 Elsevier Interactive Patient Education  2017 [place] Prevention in the Home Falls can cause injuries. They can happen to people of all ages. There are many things you can do to make your home safe and to help prevent falls. What can I do on the outside of my home? Regularly fix the edges of walkways and driveways and fix any cracks. Remove anything that might make you trip as you walk through a door, such as a raised step or threshold. Trim any bushes or trees on the path to your home. Use bright outdoor lighting. Clear any walking paths of anything that might make someone trip, such as rocks or tools. Regularly check to see if handrails are loose or broken. Make sure that both sides of any steps have handrails. Any raised decks and porches should have guardrails on the edges. Have any leaves, snow, or ice cleared regularly. Use sand or salt on walking paths during winter. Clean up any spills in your garage right away. This includes oil or grease spills. What can I do in the bathroom? Use night lights. Install grab bars by the toilet and in the tub and shower. Do not use towel bars as grab bars. Use non-skid mats or decals in the tub or shower. If you need to sit down in the shower, use a plastic, non-slip stool. Keep the floor dry. Clean up any water that spills on the floor as soon as it happens. Remove soap buildup in the tub or shower regularly. Attach bath mats securely with double-sided non-slip rug tape. Do not have throw rugs and other things on the floor that can make you trip. What can I do in the bedroom? Use night lights. Make sure that you have a light by your bed that is easy to reach. Do not use any sheets or blankets that are  too big for your bed. They should not hang down onto the floor. Have a firm chair that has side arms. You can use this for support while you get dressed. Do not have throw rugs and other things on the floor that can make you trip. What can I do in the kitchen? Clean up any spills right away. Avoid walking on wet floors. Keep items that you use a lot in easy-to-reach places. If you need to reach something above you, use a strong step stool that has a grab bar. Keep electrical cords out of the way. Do not use floor polish or wax that makes floors slippery. If you must use wax, use non-skid floor wax. Do not have throw rugs and other things on the floor that can make you trip. What can I do with my stairs? Do not leave any items on the stairs. Make sure that there are handrails on both sides of the stairs and use them. Fix handrails that are broken or loose. Make sure that handrails are as long as the stairways. Check any carpeting to make sure that it is firmly attached to the stairs. Fix any carpet that is loose or worn. Avoid having throw rugs at the top or bottom of the stairs. If you do have throw rugs, attach them to the floor with carpet tape. Make sure that you have  a light switch at the top of the stairs and the bottom of the stairs. If you do not have them, ask someone to add them for you. What else can I do to help prevent falls? Wear shoes that: Do not have high heels. Have rubber bottoms. Are comfortable and fit you well. Are closed at the toe. Do not wear sandals. If you use a stepladder: Make sure that it is fully opened. Do not climb a closed stepladder. Make sure that both sides of the stepladder are locked into place. Ask someone to hold it for you, if possible. Clearly mark and make sure that you can see: Any grab bars or handrails. First and last steps. Where the edge of each step is. Use tools that help you move around (mobility aids) if they are needed. These  include: Canes. Walkers. Scooters. Crutches. Turn on the lights when you go into a dark area. Replace any light bulbs as soon as they burn out. Set up your furniture so you have a clear path. Avoid moving your furniture around. If any of your floors are uneven, fix them. If there are any pets around you, be aware of where they are. Review your medicines with your doctor. Some medicines can make you feel dizzy. This can increase your chance of falling. Ask your doctor what other things that you can do to help prevent falls. This information is not intended to replace advice given to you by your health care provider. Make sure you discuss any questions you have with your health care provider. Document Released: 01/02/2009 Document Revised: 08/14/2015 Document Reviewed: 04/12/2014 Elsevier Interactive Patient Education  2017 [employer].
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
## 2021-08-31 DIAGNOSIS — Z1231 Encounter for screening mammogram for malignant neoplasm of breast: Secondary | ICD-10-CM | POA: Diagnosis not present

## 2021-09-24 DIAGNOSIS — H43393 Other vitreous opacities, bilateral: Secondary | ICD-10-CM | POA: Diagnosis not present

## 2021-09-28 DIAGNOSIS — L719 Rosacea, unspecified: Secondary | ICD-10-CM | POA: Diagnosis not present

## 2021-09-28 DIAGNOSIS — L570 Actinic keratosis: Secondary | ICD-10-CM | POA: Diagnosis not present

## 2021-09-28 DIAGNOSIS — Z1283 Encounter for screening for malignant neoplasm of skin: Secondary | ICD-10-CM | POA: Diagnosis not present

## 2021-09-28 DIAGNOSIS — L723 Sebaceous cyst: Secondary | ICD-10-CM | POA: Diagnosis not present

## 2021-10-08 DIAGNOSIS — D485 Neoplasm of uncertain behavior of skin: Secondary | ICD-10-CM | POA: Diagnosis not present

## 2021-10-29 DIAGNOSIS — D485 Neoplasm of uncertain behavior of skin: Secondary | ICD-10-CM | POA: Diagnosis not present

## 2021-10-29 DIAGNOSIS — L905 Scar conditions and fibrosis of skin: Secondary | ICD-10-CM | POA: Diagnosis not present

## 2022-02-19 ENCOUNTER — Encounter: Payer: Medicare Other | Admitting: Registered Nurse

## 2022-02-22 ENCOUNTER — Encounter: Payer: Medicare Other | Admitting: Registered Nurse

## 2022-03-29 DIAGNOSIS — L719 Rosacea, unspecified: Secondary | ICD-10-CM | POA: Diagnosis not present
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
## 2022-08-11 ENCOUNTER — Encounter: Payer: Self-pay | Admitting: Family Medicine

## 2022-08-11 ENCOUNTER — Ambulatory Visit (INDEPENDENT_AMBULATORY_CARE_PROVIDER_SITE_OTHER): Payer: Medicare Other | Admitting: Family Medicine

## 2022-08-11 VITALS — BP 124/80 | HR 72 | Temp 97.9°F | Ht 64.0 in | Wt 144.1 lb

## 2022-08-11 DIAGNOSIS — Z7689 Persons encountering health services in other specified circumstances: Secondary | ICD-10-CM

## 2022-08-11 DIAGNOSIS — Z13220 Encounter for screening for lipoid disorders: Secondary | ICD-10-CM | POA: Diagnosis not present

## 2022-08-11 DIAGNOSIS — N059 Unspecified nephritic syndrome with unspecified morphologic changes: Secondary | ICD-10-CM

## 2022-08-11 DIAGNOSIS — Z1329 Encounter for screening for other suspected endocrine disorder: Secondary | ICD-10-CM

## 2022-08-11 DIAGNOSIS — R7303 Prediabetes: Secondary | ICD-10-CM | POA: Diagnosis not present

## 2022-08-11 DIAGNOSIS — Z1231 Encounter for screening mammogram for malignant neoplasm of breast: Secondary | ICD-10-CM

## 2022-08-11 DIAGNOSIS — E559 Vitamin D deficiency, unspecified: Secondary | ICD-10-CM | POA: Diagnosis not present

## 2022-08-11 DIAGNOSIS — Z136 Encounter for screening for cardiovascular disorders: Secondary | ICD-10-CM

## 2022-08-11 LAB — COMPREHENSIVE METABOLIC PANEL
ALT: 27 U/L (ref 0–35)
AST: 23 U/L (ref 0–37)
Albumin: 4.4 g/dL (ref 3.5–5.2)
Alkaline Phosphatase: 65 U/L (ref 39–117)
BUN: 13 mg/dL (ref 6–23)
CO2: 29 mEq/L (ref 19–32)
Calcium: 9.8 mg/dL (ref 8.4–10.5)
Chloride: 105 mEq/L (ref 96–112)
Creatinine, Ser: 0.75 mg/dL (ref 0.40–1.20)
GFR: 80.23 mL/min (ref >60.00)
Glucose, Bld: 124 mg/dL — ABNORMAL HIGH (ref 70–99)
Potassium: 3.9 mEq/L (ref 3.5–5.1)
Sodium: 140 mEq/L (ref 135–145)
Total Bilirubin: 0.9 mg/dL (ref 0.2–1.2)
Total Protein: 7.4 g/dL (ref 6.0–8.3)

## 2022-08-11 LAB — CBC WITH DIFFERENTIAL/PLATELET
Basophils Absolute: 0 10*3/uL (ref 0.0–0.1)
Basophils Relative: 0.6 % (ref 0.0–3.0)
Eosinophils Absolute: 0.1 10*3/uL (ref 0.0–0.7)
Eosinophils Relative: 0.9 % (ref 0.0–5.0)
HCT: 44 % (ref 36.0–46.0)
Hemoglobin: 14.8 g/dL (ref 12.0–15.0)
Lymphocytes Relative: 35.8 % (ref 12.0–46.0)
Lymphs Abs: 2.1 10*3/uL (ref 0.7–4.0)
MCHC: 33.6 g/dL (ref 30.0–36.0)
MCV: 97 fl (ref 78.0–100.0)
Monocytes Absolute: 0.6 10*3/uL (ref 0.1–1.0)
Monocytes Relative: 9.5 % (ref 3.0–12.0)
Neutro Abs: 3.1 10*3/uL (ref 1.4–7.7)
Neutrophils Relative %: 53.2 % (ref 43.0–77.0)
Platelets: 247 10*3/uL (ref 150.0–400.0)
RBC: 4.53 Mil/uL (ref 3.87–5.11)
RDW: 12.9 % (ref 11.5–15.5)
WBC: 5.8 10*3/uL (ref 4.0–10.5)

## 2022-08-11 LAB — LIPID PANEL
Cholesterol: 192 mg/dL (ref 0–200)
HDL: 63.1 mg/dL (ref >39.00)
LDL Cholesterol: 104 mg/dL — ABNORMAL HIGH (ref 0–99)
NonHDL: 128.85
Total CHOL/HDL Ratio: 3
Triglycerides: 124 mg/dL (ref 0.0–149.0)
VLDL: 24.8 mg/dL (ref 0.0–40.0)

## 2022-08-11 LAB — VITAMIN D 25 HYDROXY (VIT D DEFICIENCY, FRACTURES): VITD: 52.39 ng/mL (ref 30.00–100.00)

## 2022-08-11 LAB — HEMOGLOBIN A1C: Hgb A1c MFr Bld: 6.4 % (ref 4.6–6.5)

## 2022-08-11 LAB — TSH: TSH: 2.64 u[IU]/mL (ref 0.35–5.50)

## 2022-08-11 NOTE — Patient Instructions (Signed)
-  It was a pleasure to meet you and I look forward to taking care of you. -Ordered labs for screening. Office will call with lab results and you may see them on MyChart. -Make an AWV appointment for telehealth.  -Follow up in 1 year.

## 2022-09-02 ENCOUNTER — Ambulatory Visit (INDEPENDENT_AMBULATORY_CARE_PROVIDER_SITE_OTHER): Payer: Medicare Other | Admitting: *Deleted

## 2022-09-02 DIAGNOSIS — Z0000 Encounter for general adult medical examination without abnormal findings: Secondary | ICD-10-CM
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
## 2022-09-02 NOTE — Patient Instructions (Signed)
Debra Khan , Thank you for taking time to come for your Medicare Wellness Visit. I appreciate your ongoing commitment to your health goals. Please review the following plan we discussed and let me know if I can assist you in the future.   Screening recommendations/referrals: Colonoscopy: up to date Mammogram: up to date Bone Density: up to date Recommended yearly ophthalmology/optometry visit for glaucoma screening and checkup Recommended yearly dental visit for hygiene and checkup  Vaccinations: Influenza vaccine: up to date Pneumococcal vaccine: up tod ate Tdap vaccine: Education provided Shingles vaccine: up to date    Advanced directives: not on file    Preventive Care 65 Years and Older, Female Preventive care refers to lifestyle choices and visits with your health care provider that can promote health and wellness. What does preventive care include? A yearly physical exam. This is also called an annual well check. Dental exams once or twice a year. Routine eye exams. Ask your health care provider how often you should have your eyes checked. Personal lifestyle choices, including: Daily care of your teeth and gums. Regular physical activity. Eating a healthy diet. Avoiding tobacco and drug use. Limiting alcohol use. Practicing safe sex. Taking low-dose aspirin every day. Taking vitamin and mineral supplements as recommended by your health care provider. What happens during an annual well check? The services and screenings done by your health care provider during your annual well check will depend on your age, overall health, lifestyle risk factors, and family history of disease. Counseling  Your health care provider may ask you questions about your: Alcohol use. Tobacco use. Drug use. Emotional well-being. Home and relationship well-being. Sexual activity. Eating habits. History of falls. Memory and ability to understand (cognition). Work and work  Astronomer. Reproductive health. Screening  You may have the following tests or measurements: Height, weight, and BMI. Blood pressure. Lipid and cholesterol levels. These may be checked every 5 years, or more frequently if you are over 63 years old. Skin check. Lung cancer screening. You may have this screening every year starting at age 49 if you have a 30-pack-year history of smoking and currently smoke or have quit within the past 15 years. Fecal occult blood test (FOBT) of the stool. You may have this test every year starting at age 11. Flexible sigmoidoscopy or colonoscopy. You may have a sigmoidoscopy every 5 years or a colonoscopy every 10 years starting at age 14. Hepatitis C blood test. Hepatitis B blood test. Sexually transmitted disease (STD) testing. Diabetes screening. This is done by checking your blood sugar (glucose) after you have not eaten for a while (fasting). You may have this done every 1-3 years. Bone density scan. This is done to screen for osteoporosis. You may have this done starting at age 70. Mammogram. This may be done every 1-2 years. Talk to your health care provider about how often you should have regular mammograms. Talk with your health care provider about your test results, treatment options, and if necessary, the need for more tests. Vaccines  Your health care provider may recommend certain vaccines, such as: Influenza vaccine. This is recommended every year. Tetanus, diphtheria, and acellular pertussis (Tdap, Td) vaccine. You may need a Td booster every 10 years. Zoster vaccine. You may need this after age 81. Pneumococcal 13-valent conjugate (PCV13) vaccine. One dose is recommended after age 83. Pneumococcal polysaccharide (PPSV23) vaccine. One dose is recommended after age 13. Talk to your health care provider about which screenings and vaccines you need and how  often you need them. This information is not intended to replace advice given to you by  your health care provider. Make sure you discuss any questions you have with your health care provider. Document Released: 04/04/2015 Document Revised: 11/26/2015 Document Reviewed: 01/07/2015 Elsevier Interactive Patient Education  2017 [employer].  Fall Prevention in the Home Falls can cause injuries. They can happen to people of all ages. There are many things you can do to make your home safe and to help prevent falls. What can I do on the outside of my home? Regularly fix the edges of walkways and driveways and fix any cracks. Remove anything that might make you trip as you walk through a door, such as a raised step or threshold. Trim any bushes or trees on the path to your home. Use bright outdoor lighting. Clear any walking paths of anything that might make someone trip, such as rocks or tools. Regularly check to see if handrails are loose or broken. Make sure that both sides of any steps have handrails. Any raised decks and porches should have guardrails on the edges. Have any leaves, snow, or ice cleared regularly. Use sand or salt on walking paths during winter. Clean up any spills in your garage right away. This includes oil or grease spills. What can I do in the bathroom? Use night lights. Install grab bars by the toilet and in the tub and shower. Do not use towel bars as grab bars. Use non-skid mats or decals in the tub or shower. If you need to sit down in the shower, use a plastic, non-slip stool. Keep the floor dry. Clean up any water that spills on the floor as soon as it happens. Remove soap buildup in the tub or shower regularly. Attach bath mats securely with double-sided non-slip rug tape. Do not have throw rugs and other things on the floor that can make you trip. What can I do in the bedroom? Use night lights. Make sure that you have a light by your bed that is easy to reach. Do not use any sheets or blankets that are too big for your bed. They should not hang  down onto the floor. Have a firm chair that has side arms. You can use this for support while you get dressed. Do not have throw rugs and other things on the floor that can make you trip. What can I do in the kitchen? Clean up any spills right away. Avoid walking on wet floors. Keep items that you use a lot in easy-to-reach places. If you need to reach something above you, use a strong step stool that has a grab bar. Keep electrical cords out of the way. Do not use floor polish or wax that makes floors slippery. If you must use wax, use non-skid floor wax. Do not have throw rugs and other things on the floor that can make you trip. What can I do with my stairs? Do not leave any items on the stairs. Make sure that there are handrails on both sides of the stairs and use them. Fix handrails that are broken or loose. Make sure that handrails are as long as the stairways. Check any carpeting to make sure that it is firmly attached to the stairs. Fix any carpet that is loose or worn. Avoid having throw rugs at the top or bottom of the stairs. If you do have throw rugs, attach them to the floor with carpet tape. Make sure that you have a  light switch at the top of the stairs and the bottom of the stairs. If you do not have them, ask someone to add them for you. What else can I do to help prevent falls? Wear shoes that: Do not have high heels. Have rubber bottoms. Are comfortable and fit you well. Are closed at the toe. Do not wear sandals. If you use a stepladder: Make sure that it is fully opened. Do not climb a closed stepladder. Make sure that both sides of the stepladder are locked into place. Ask someone to hold it for you, if possible. Clearly mark and make sure that you can see: Any grab bars or handrails. First and last steps. Where the edge of each step is. Use tools that help you move around (mobility aids) if they are needed. These  include: Canes. Walkers. Scooters. Crutches. Turn on the lights when you go into a dark area. Replace any light bulbs as soon as they burn out. Set up your furniture so you have a clear path. Avoid moving your furniture around. If any of your floors are uneven, fix them. If there are any pets around you, be aware of where they are. Review your medicines with your doctor. Some medicines can make you feel dizzy. This can increase your chance of falling. Ask your doctor what other things that you can do to help prevent falls. This information is not intended to replace advice given to you by your health care provider. Make sure you discuss any questions you have with your health care provider. Document Released: 01/02/2009 Document Revised: 08/14/2015 Document Reviewed: 04/12/2014 Elsevier Interactive Patient Education  2017 [employer].

## 2022-09-15 DIAGNOSIS — L719 Rosacea, unspecified: Secondary | ICD-10-CM | POA: Diagnosis not present

## 2022-09-15 DIAGNOSIS — Z1283 Encounter for screening for malignant neoplasm of skin: Secondary | ICD-10-CM | POA: Diagnosis not present

## 2022-09-15 DIAGNOSIS — D485 Neoplasm of uncertain behavior of skin: Secondary | ICD-10-CM | POA: Diagnosis not present

## 2022-09-15 DIAGNOSIS — L570 Actinic keratosis: Secondary | ICD-10-CM | POA: Diagnosis not present

## 2022-09-27 DIAGNOSIS — H43393 Other vitreous opacities, bilateral: Secondary | ICD-10-CM | POA: Diagnosis not present

## 2022-09-27 DIAGNOSIS — H524 Presbyopia: Secondary | ICD-10-CM | POA: Diagnosis not present

## 2023-02-03 DIAGNOSIS — Z23 Encounter for immunization: Secondary | ICD-10-CM | POA: Diagnosis not present

## 2023-02-09 ENCOUNTER — Telehealth: Payer: Self-pay | Admitting: Family Medicine

## 2023-02-09 NOTE — Telephone Encounter (Signed)
Requesting referral to gastroenterologist [facility]  (p) [phone]

## 2023-02-10 ENCOUNTER — Other Ambulatory Visit: Payer: Self-pay | Admitting: Family

## 2023-02-10 ENCOUNTER — Telehealth: Payer: Self-pay | Admitting: Family Medicine

## 2023-02-10 NOTE — Telephone Encounter (Addendum)
ERROR_Please Disregard

## 2023-02-10 NOTE — Telephone Encounter (Signed)
Pt called to F/U on her request for NP to please send a referral for her to see a Gastroenterologist?  Pt states she has been experiencing a lot of abdominal pain.    Preferably at: [facility] [phone]

## 2023-02-14 NOTE — Telephone Encounter (Signed)
Spoke to pt. Pt states she was told if she has a medicare and supplement, she doesn't need a referral sent, in order to schedule the appt with GI. Pt ask if that is correct. Inform pt that I don't know that information but she can call GI and let them know she has a medicare and supplement to see if they still require one.

## 2023-02-14 NOTE — Telephone Encounter (Signed)
Also inform pt of provider's advise. Pt verbalized understanding. Pt states she didn't know Debra Abts, NP moved location. Pt would like to be seen at [facility] location.  Inform pt she can schedule with provider there to be seen. Pt verbalize understanding and states she will call them if a referral is required.

## 2023-02-15 ENCOUNTER — Encounter: Payer: Self-pay | Admitting: Family Medicine

## 2023-02-15 ENCOUNTER — Ambulatory Visit (INDEPENDENT_AMBULATORY_CARE_PROVIDER_SITE_OTHER): Payer: Medicare Other | Admitting: Family Medicine

## 2023-02-15 VITALS — BP 126/74 | HR 74 | Temp 98.4°F | Ht 64.0 in | Wt 148.4 lb

## 2023-02-15 DIAGNOSIS — K219 Gastro-esophageal reflux disease without esophagitis: Secondary | ICD-10-CM | POA: Diagnosis not present

## 2023-02-15 NOTE — Patient Instructions (Addendum)
-  Placed a referral to [facility] for continued gastric reflux symptoms. Please call back to the office or send MyChart if you do not receive a phone call or MyChart in 2 weeks about an appointment.  -Happy Thanksgiving!

## 2023-02-15 NOTE — Progress Notes (Signed)
   Established Patient Office Visit   Subjective:  Patient ID: Debra Khan, female    DOB: Mar 18, 1951  Age: 72 y.o. MRN: [id number]  Chief Complaint  Patient presents with   Seeking referral     Seeking gastro referral to [facility] gastro     HPI Patient is wanting a referral to [facility] for continued GERD symptoms. She reports she has been on several medications for GERD through the years and it not relieving her symptoms. Currently, not taking any medications. She reports she is experiencing epigastric burning pain about 10 minutes after eating anything and burning in her throat. She reports she has to take bake soda and water to relief the symptoms for a brief time. Also, reports being intermittent nausea and vomits every once in a while.   Denies chest pain or shortness of breath. Denies any other abd pain besides the epigastric burning. Denies hematemesis.   Patient has seen GI for colonoscopy before, but never been seen for GERD or had an endoscopy.  ROS See HPI above     Objective:   BP 126/74   Pulse 74   Temp 98.4 F (36.9 C) (Oral)   Ht 5\' 4"  (1.626 m)   Wt 148 lb 6.4 oz (67.3 kg)   SpO2 96%   BMI 25.47 kg/m    Physical Exam Vitals reviewed.  Constitutional:      General: She is not in acute distress.    Appearance: Normal appearance. She is not ill-appearing, toxic-appearing or diaphoretic.  HENT:     Head: Normocephalic and atraumatic.  Eyes:     General:        Right eye: No discharge.        Left eye: No discharge.     Conjunctiva/sclera: Conjunctivae normal.  Cardiovascular:     Rate and Rhythm: Normal rate and regular rhythm.     Heart sounds: Normal heart sounds. No murmur heard.    No friction rub. No gallop.  Pulmonary:     Effort: Pulmonary effort is normal. No respiratory distress.     Breath sounds: Normal breath sounds.  Abdominal:     General: Bowel sounds are normal. There is no distension.     Palpations: Abdomen is  soft.     Tenderness: There is no abdominal tenderness.  Musculoskeletal:        General: Normal range of motion.  Skin:    General: Skin is warm and dry.  Neurological:     General: No focal deficit present.     Mental Status: She is alert and oriented to person, place, and time. Mental status is at baseline.  Psychiatric:        Mood and Affect: Mood normal.        Behavior: Behavior normal.        Thought Content: Thought content normal.        Judgment: Judgment normal.      Assessment & Plan:  Gastroesophageal reflux disease without esophagitis -     Ambulatory referral to Gastroenterology   1.Review health maintenance: -Tdap vaccine: Declines  -Mammogram: 08/2021, [facility]-requesting records  -Covid booster: Declines  -PNA vaccine: Declines  2.Placed a referral to [facility] GI for continued GERD symptoms.   Zandra Abts, NP

## 2023-02-28 DIAGNOSIS — H43393 Other vitreous opacities, bilateral: Secondary | ICD-10-CM | POA: Diagnosis not present

## 2023-03-01 ENCOUNTER — Encounter (INDEPENDENT_AMBULATORY_CARE_PROVIDER_SITE_OTHER): Payer: Self-pay | Admitting: *Deleted

## 2023-03-08 DIAGNOSIS — L719 Rosacea, unspecified: Secondary | ICD-10-CM | POA: Diagnosis not present

## 2023-03-08 DIAGNOSIS — L728 Other follicular cysts of the skin and subcutaneous tissue: Secondary | ICD-10-CM | POA: Diagnosis not present

## 2023-03-24 ENCOUNTER — Ambulatory Visit (INDEPENDENT_AMBULATORY_CARE_PROVIDER_SITE_OTHER): Payer: Medicare Other | Admitting: Gastroenterology

## 2023-03-24 ENCOUNTER — Encounter (INDEPENDENT_AMBULATORY_CARE_PROVIDER_SITE_OTHER): Payer: Self-pay | Admitting: Gastroenterology

## 2023-03-24 VITALS — BP 146/87 | HR 90 | Temp 98.6°F | Ht 64.0 in | Wt 145.6 lb

## 2023-03-24 DIAGNOSIS — K219 Gastro-esophageal reflux disease without esophagitis: Secondary | ICD-10-CM | POA: Diagnosis not present

## 2023-03-24 MED ORDER — OMEPRAZOLE 40 MG PO CPDR: 40.0000 mg | DELAYED_RELEASE_CAPSULE | Freq: Every day | ORAL | 3 refills | Status: DC
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
## 2023-03-24 NOTE — Patient Instructions (Addendum)
 Schedule EGD Start omeprazole  40 g every day Explained presumed etiology of reflux symptoms. Instruction provided in the use of antireflux medication - patient should take medication in the morning 30-45 minutes before eating breakfast. Discussed avoidance of eating within 2 hours of lying down to sleep and benefit of blocks to elevate head of bed. Also, will benefit from avoiding carbonated drinks/sodas or food that has tomatoes, spicy or greasy food.

## 2023-03-29 DIAGNOSIS — H2513 Age-related nuclear cataract, bilateral: Secondary | ICD-10-CM | POA: Diagnosis not present

## 2023-03-29 DIAGNOSIS — H18523 Epithelial (juvenile) corneal dystrophy, bilateral: Secondary | ICD-10-CM | POA: Diagnosis not present

## 2023-04-06 ENCOUNTER — Encounter (HOSPITAL_COMMUNITY): Payer: Self-pay | Admitting: Gastroenterology

## 2023-04-06 ENCOUNTER — Ambulatory Visit (HOSPITAL_COMMUNITY)
Admission: RE | Admit: 2023-04-06 | Discharge: 2023-04-06 | Disposition: A | Discharge status: Home / Self Care | Payer: Medicare Other | Source: Ambulatory Visit | Attending: Gastroenterology | Admitting: Gastroenterology

## 2023-04-06 ENCOUNTER — Ambulatory Visit (HOSPITAL_COMMUNITY): Payer: Medicare Other | Admitting: Certified Registered"

## 2023-04-06 ENCOUNTER — Other Ambulatory Visit: Payer: Self-pay

## 2023-04-06 ENCOUNTER — Encounter (HOSPITAL_COMMUNITY)
Admission: RE | Disposition: A | Discharge status: Home / Self Care | Payer: Self-pay | Source: Ambulatory Visit | Attending: Gastroenterology

## 2023-04-06 DIAGNOSIS — K449 Diaphragmatic hernia without obstruction or gangrene: Secondary | ICD-10-CM

## 2023-04-06 DIAGNOSIS — N289 Disorder of kidney and ureter, unspecified: Secondary | ICD-10-CM | POA: Diagnosis not present

## 2023-04-06 DIAGNOSIS — M8580 Other specified disorders of bone density and structure, unspecified site: Secondary | ICD-10-CM | POA: Insufficient documentation

## 2023-04-06 DIAGNOSIS — Z79899 Other long term (current) drug therapy: Secondary | ICD-10-CM | POA: Insufficient documentation

## 2023-04-06 DIAGNOSIS — K219 Gastro-esophageal reflux disease without esophagitis: Secondary | ICD-10-CM

## 2023-04-06 DIAGNOSIS — K2100 Gastro-esophageal reflux disease with esophagitis, without bleeding: Secondary | ICD-10-CM | POA: Diagnosis not present

## 2023-04-06 DIAGNOSIS — M1990 Unspecified osteoarthritis, unspecified site: Secondary | ICD-10-CM | POA: Insufficient documentation

## 2023-04-06 HISTORY — PX: ESOPHAGOGASTRODUODENOSCOPY (EGD) WITH PROPOFOL: SHX5813

## 2023-04-06 SURGERY — ESOPHAGOGASTRODUODENOSCOPY (EGD) WITH PROPOFOL
Anesthesia: General

## 2023-04-06 MED ORDER — SODIUM CHLORIDE 0.9% FLUSH: 3.0000 mL | INTRAVENOUS | Status: DC | PRN

## 2023-04-06 MED ORDER — LACTATED RINGERS IV SOLN: INTRAVENOUS | Status: DC | PRN

## 2023-04-06 MED ORDER — PROPOFOL 10 MG/ML IV BOLUS
INTRAVENOUS | Status: DC | PRN
  Administered 2023-04-06: 70 mg via INTRAVENOUS
  Administered 2023-04-06: 20 mg via INTRAVENOUS

## 2023-04-06 MED ORDER — STERILE WATER FOR IRRIGATION IR SOLN
Status: DC | PRN
  Administered 2023-04-06: 120 mL

## 2023-04-06 MED ORDER — SODIUM CHLORIDE 0.9% FLUSH: 3.0000 mL | Freq: Two times a day (BID) | INTRAVENOUS | Status: DC

## 2023-04-06 NOTE — Anesthesia Preprocedure Evaluation (Signed)
 Anesthesia Evaluation  Patient identified by MRN, date of birth, ID band Patient awake    Reviewed: Allergy & Precautions, H&P , NPO status , Patient's Chart, lab work & pertinent test results  History of Anesthesia Complications Negative for: history of anesthetic complications  Airway Mallampati: II  TM Distance: >3 FB Neck ROM: full    Dental no notable dental hx. (+) Teeth Intact, Dental Advisory Given   Pulmonary neg pulmonary ROS   Pulmonary exam normal breath sounds clear to auscultation       Cardiovascular negative cardio ROS Normal cardiovascular exam Rhythm:regular Rate:Normal     Neuro/Psych  Neuromuscular disease    GI/Hepatic negative GI ROS, Neg liver ROS,,,  Endo/Other  negative endocrine ROS    Renal/GU 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     Musculoskeletal   Abdominal Normal abdominal exam  (+)   Peds  Hematology negative hematology ROS (+)   Anesthesia Other Findings   Reproductive/Obstetrics negative OB ROS                             Anesthesia Physical Anesthesia Plan  ASA: 2  Anesthesia Plan: General   Post-op Pain Management: Minimal or no pain anticipated   Induction: Intravenous  PONV Risk Score and Plan: Propofol  infusion  Airway Management Planned: Nasal Cannula and Natural Airway  Additional Equipment: None  Intra-op Plan:   Post-operative Plan:   Informed Consent: I have reviewed the patients History and Physical, chart, labs and discussed the procedure including the risks, benefits and alternatives for the proposed anesthesia with the patient or authorized representative who has indicated his/her understanding and acceptance.       Plan Discussed with: CRNA  Anesthesia Plan Comments:         Anesthesia Quick Evaluation

## 2023-04-06 NOTE — Interval H&P Note (Signed)
 History and Physical Interval Note:  04/06/2023 10:08 AM  Debra Khan  has presented today for surgery, with the diagnosis of gerd, dysphagia.  The various methods of treatment have been discussed with the patient and family. After consideration of risks, benefits and other options for treatment, the patient has consented to  Procedure(s) with comments: ESOPHAGOGASTRODUODENOSCOPY (EGD) WITH PROPOFOL  (N/A) - 1:00pm;asa 1 as a surgical intervention.  [name] history has been reviewed, patient examined, no change in status, stable for surgery.  I have reviewed the patient's chart and labs.  Questions were answered to the patient's satisfaction.     Ivone Licht Castaneda Mayorga

## 2023-04-06 NOTE — Discharge Instructions (Signed)
 You are being discharged to home.  Resume your previous diet.  Continue your present medications.  If interested in undergoing combo TIF, will need to undergo esophagogram and will refer to Dr. Larrie Po.

## 2023-04-06 NOTE — Transfer of Care (Signed)
 Immediate Anesthesia Transfer of Care Note  Patient: Debra Khan  Procedure(s) Performed: ESOPHAGOGASTRODUODENOSCOPY (EGD) WITH PROPOFOL   Patient Location: PACU  Anesthesia Type:General  Level of Consciousness: awake, alert , oriented, and patient cooperative  Airway & Oxygen Therapy: Patient Spontanous Breathing  Post-op Assessment: Report given to RN, Post -op Vital signs reviewed and stable, and Patient moving all extremities X 4  Post vital signs: Reviewed and stable  Last Vitals:  Vitals Value Taken Time  BP 107/57 04/06/23 1033  Temp 36.9 C 04/06/23 1033  Pulse 75 04/06/23 1033  Resp 18 04/06/23 1033  SpO2 97 % 04/06/23 1033    Last Pain:  Vitals:   04/06/23 1033  TempSrc: Oral  PainSc: 0-No pain      Patients Stated Pain Goal: 6 (04/06/23 1006)  Complications: No notable events documented.

## 2023-04-06 NOTE — Anesthesia Postprocedure Evaluation (Signed)
 Anesthesia Post Note  Patient: Debra Khan  Procedure(s) Performed: ESOPHAGOGASTRODUODENOSCOPY (EGD) WITH PROPOFOL   Patient location during evaluation: PACU Anesthesia Type: General Level of consciousness: awake and alert Pain management: pain level controlled Vital Signs Assessment: post-procedure vital signs reviewed and stable Respiratory status: spontaneous breathing, nonlabored ventilation, respiratory function stable and patient connected to nasal cannula oxygen Cardiovascular status: blood pressure returned to baseline and stable Postop Assessment: no apparent nausea or vomiting Anesthetic complications: no   There were no known notable events for this encounter.   Last Vitals:  Vitals:   04/06/23 1010 04/06/23 1033  BP: 138/75 (!) 107/57  Pulse: 78 75  Resp: 17 18  Temp: 36.6 C 36.9 C  SpO2: 97% 97%    Last Pain:  Vitals:   04/06/23 1033  TempSrc: Oral  PainSc: 0-No pain                 Riker Collier L Lygia Olaes

## 2023-04-06 NOTE — Op Note (Signed)
 [facility] Patient Name: Debra Khan Procedure Date: 04/06/2023 10:05 AM MRN: [id number] Date of Birth: 11-28-1950 Attending MD: Samantha Cress , , [id number] CSN: [id number] Age: 73 Admit Type: Outpatient Procedure:                Upper GI endoscopy Indications:              Gastro-esophageal reflux disease Providers:                Samantha Cress, Willena Harp, Theola Fitch Referring MD:              Medicines:                Monitored Anesthesia Care Complications:            No immediate complications. Estimated Blood Loss:     Estimated blood loss: none. Procedure:                Pre-Anesthesia Assessment:                           - Prior to the procedure, a History and Physical                            was performed, and patient medications, allergies                            and sensitivities were reviewed. [name]                            tolerance of previous anesthesia was reviewed.                           - The risks and benefits of the procedure and the                            sedation options and risks were discussed with the                            patient. All questions were answered and informed                            consent was obtained.                           - ASA Grade Assessment: II - A patient with mild                            systemic disease.                           After obtaining informed consent, the endoscope was                            passed under direct vision. Throughout the                            procedure, the patient's blood pressure,  pulse, and                            oxygen saturations were monitored continuously. The                            [id number]-[id number] ([id number]) scope was introduced through the                            mouth, and advanced to the second part of duodenum.                            The upper GI endoscopy was accomplished without                            difficulty.  The patient tolerated the procedure                            well. Scope In: 10:24:41 AM Scope Out: 10:28:40 AM Total Procedure Duration: 0 hours 3 minutes 59 seconds  Findings:      A 3 cm hiatal hernia was found. The hiatal narrowing was 36 cm from the       incisors. The Z-line was 33 cm from the incisors.      The stomach was normal.      The gastroesophageal flap valve was visualized endoscopically and       classified as Hill Grade III (minimal fold, loose to endoscope, hiatal       hernia likely).      The examined duodenum was normal. Impression:               - 3 cm hiatal hernia.                           - Normal stomach.                           - Normal examined duodenum.                           - No specimens collected. Moderate Sedation:      Per Anesthesia Care Recommendation:           - Discharge patient to home (ambulatory).                           - Resume previous diet.                           - Continue present medications.                           - If interested in undergoing combo TIF, will need                            to undergo esophagogram and will refer to Dr.  Jenkins. Procedure Code(s):        --- Professional ---                           [phone], Esophagogastroduodenoscopy, flexible,                            transoral; diagnostic, including collection of                            specimen(s) by brushing or washing, when performed                            (separate procedure) Diagnosis Code(s):        --- Professional ---                           K44.9, Diaphragmatic hernia without obstruction or                            gangrene                           K21.9, Gastro-esophageal reflux disease without                            esophagitis CPT copyright 2022 [employer]. All rights reserved. The codes documented in this report are preliminary and upon coder review may  be revised to  meet current compliance requirements. Samantha Cress, MD Samantha Cress,  04/06/2023 10:35:37 AM This report has been signed electronically. Number of Addenda: 0

## 2023-04-07 ENCOUNTER — Encounter (HOSPITAL_COMMUNITY): Payer: Self-pay | Admitting: Gastroenterology

## 2023-04-08 DIAGNOSIS — H18523 Epithelial (juvenile) corneal dystrophy, bilateral: Secondary | ICD-10-CM | POA: Diagnosis not present

## 2023-06-06 DIAGNOSIS — H2511 Age-related nuclear cataract, right eye: Secondary | ICD-10-CM | POA: Diagnosis not present

## 2023-06-06 DIAGNOSIS — H2512 Age-related nuclear cataract, left eye: Secondary | ICD-10-CM | POA: Diagnosis not present

## 2023-06-15 DIAGNOSIS — H2512 Age-related nuclear cataract, left eye: Secondary | ICD-10-CM | POA: Diagnosis not present

## 2023-06-15 DIAGNOSIS — K219 Gastro-esophageal reflux disease without esophagitis: Secondary | ICD-10-CM | POA: Diagnosis not present

## 2023-06-27 ENCOUNTER — Ambulatory Visit (INDEPENDENT_AMBULATORY_CARE_PROVIDER_SITE_OTHER): Payer: Medicare Other | Admitting: Gastroenterology

## 2023-06-27 ENCOUNTER — Encounter (INDEPENDENT_AMBULATORY_CARE_PROVIDER_SITE_OTHER): Payer: Self-pay | Admitting: Gastroenterology

## 2023-06-27 VITALS — BP 129/79 | HR 82 | Temp 98.1°F | Ht 64.0 in | Wt 144.6 lb

## 2023-06-27 DIAGNOSIS — K219 Gastro-esophageal reflux disease without esophagitis: Secondary | ICD-10-CM

## 2023-06-27 DIAGNOSIS — K449 Diaphragmatic hernia without obstruction or gangrene: Secondary | ICD-10-CM | POA: Diagnosis not present
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
## 2023-06-27 NOTE — Patient Instructions (Signed)
 We will send referral to Dr. Lovell Sheehan to evaluate for possible hiatal hernia repair at time of TIF procedure We will get you scheduled for barium swallow test as part of the pre procedural workup for TIF   Follow up 6 months  It was a pleasure to see you today. I want to create trusting relationships with patients and provide genuine, compassionate, and quality care. I truly value your feedback! please be on the lookout for a survey regarding your visit with me today. I appreciate your input about our visit and your time in completing this!    Debra Khan L. Jeanmarie Hubert, MSN, APRN, AGNP-C Adult-Gerontology Nurse Practitioner [facility] Gastroenterology at [facility]

## 2023-06-28 ENCOUNTER — Telehealth (INDEPENDENT_AMBULATORY_CARE_PROVIDER_SITE_OTHER): Payer: Self-pay | Admitting: Gastroenterology

## 2023-06-28 NOTE — Telephone Encounter (Signed)
 Thanks for the heads up [facility] . Will do EGD with BRAVO OFF PPI for atleast 10-14 days

## 2023-06-29 DIAGNOSIS — K219 Gastro-esophageal reflux disease without esophagitis: Secondary | ICD-10-CM | POA: Diagnosis not present

## 2023-06-29 DIAGNOSIS — H2511 Age-related nuclear cataract, right eye: Secondary | ICD-10-CM | POA: Diagnosis not present

## 2023-06-29 NOTE — Telephone Encounter (Signed)
 Will need to call patient with May schedule. Pt contacted and verbalized understanding

## 2023-07-04 ENCOUNTER — Ambulatory Visit (HOSPITAL_COMMUNITY)
Admission: RE | Admit: 2023-07-04 | Discharge: 2023-07-04 | Disposition: A | Discharge status: Home / Self Care | Source: Ambulatory Visit | Attending: Gastroenterology | Admitting: Gastroenterology

## 2023-07-04 DIAGNOSIS — K219 Gastro-esophageal reflux disease without esophagitis: Secondary | ICD-10-CM | POA: Diagnosis not present

## 2023-07-04 DIAGNOSIS — K224 Dyskinesia of esophagus: Secondary | ICD-10-CM | POA: Diagnosis not present

## 2023-07-04 DIAGNOSIS — K449 Diaphragmatic hernia without obstruction or gangrene: Secondary | ICD-10-CM | POA: Diagnosis not present

## 2023-07-05 ENCOUNTER — Encounter (INDEPENDENT_AMBULATORY_CARE_PROVIDER_SITE_OTHER): Payer: Self-pay

## 2023-07-06 NOTE — Telephone Encounter (Signed)
 Pt contacted and scheduled. Instructions will be mailed to pt.

## 2023-07-19 ENCOUNTER — Ambulatory Visit (INDEPENDENT_AMBULATORY_CARE_PROVIDER_SITE_OTHER): Admitting: General Surgery

## 2023-07-19 ENCOUNTER — Encounter: Payer: Self-pay | Admitting: General Surgery

## 2023-07-19 VITALS — BP 149/85 | HR 79 | Temp 98.1°F | Resp 16 | Ht 64.0 in | Wt 146.0 lb

## 2023-07-19 DIAGNOSIS — K219 Gastro-esophageal reflux disease without esophagitis: Secondary | ICD-10-CM
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
## 2023-07-26 ENCOUNTER — Telehealth (INDEPENDENT_AMBULATORY_CARE_PROVIDER_SITE_OTHER): Payer: Self-pay | Admitting: Gastroenterology

## 2023-07-26 ENCOUNTER — Telehealth: Payer: Self-pay | Admitting: *Deleted

## 2023-07-26 DIAGNOSIS — K449 Diaphragmatic hernia without obstruction or gangrene: Secondary | ICD-10-CM

## 2023-07-26 DIAGNOSIS — K219 Gastro-esophageal reflux disease without esophagitis: Secondary | ICD-10-CM

## 2023-07-26 NOTE — Telephone Encounter (Signed)
 Per Dr.Castaneda-TIF 09/07/23. Dr.Castaneda part will begin around 1. Dr.Castaneda would like the afternoon after 1pm blocked. Sent message to [place] at Dr.Jenkins office for scheduling

## 2023-07-26 NOTE — Telephone Encounter (Signed)
 EGD and TIF pamphlet mailed to pt.

## 2023-07-26 NOTE — Telephone Encounter (Signed)
 Reviewed with Dr. Sherrell Dodrill and surgery date of 09/07/2023 offered.   Patient made aware and agreeable to plan.   Surgery scheduled.

## 2023-08-01 ENCOUNTER — Emergency Department (HOSPITAL_COMMUNITY)

## 2023-08-01 ENCOUNTER — Telehealth (INDEPENDENT_AMBULATORY_CARE_PROVIDER_SITE_OTHER): Payer: Self-pay | Admitting: Gastroenterology

## 2023-08-01 ENCOUNTER — Emergency Department (HOSPITAL_COMMUNITY)
Admission: EM | Admit: 2023-08-01 | Discharge: 2023-08-01 | Disposition: A | Discharge status: Home / Self Care | Attending: Emergency Medicine | Admitting: Emergency Medicine

## 2023-08-01 ENCOUNTER — Encounter (HOSPITAL_COMMUNITY): Payer: Self-pay | Admitting: *Deleted

## 2023-08-01 ENCOUNTER — Other Ambulatory Visit: Payer: Self-pay

## 2023-08-01 DIAGNOSIS — J168 Pneumonia due to other specified infectious organisms: Secondary | ICD-10-CM | POA: Diagnosis not present

## 2023-08-01 DIAGNOSIS — J984 Other disorders of lung: Secondary | ICD-10-CM | POA: Diagnosis not present

## 2023-08-01 DIAGNOSIS — R059 Cough, unspecified: Secondary | ICD-10-CM | POA: Diagnosis not present

## 2023-08-01 DIAGNOSIS — J189 Pneumonia, unspecified organism: Secondary | ICD-10-CM

## 2023-08-01 DIAGNOSIS — J40 Bronchitis, not specified as acute or chronic: Secondary | ICD-10-CM | POA: Diagnosis not present

## 2023-08-01 LAB — BASIC METABOLIC PANEL WITH GFR
Anion gap: 10 (ref 5–15)
BUN: 14 mg/dL (ref 8–23)
CO2: 25 mmol/L (ref 22–32)
Calcium: 9.5 mg/dL (ref 8.9–10.3)
Chloride: 103 mmol/L (ref 98–111)
Creatinine, Ser: 0.76 mg/dL (ref 0.44–1.00)
GFR, Estimated: >60 mL/min (ref >60)
Glucose, Bld: 237 mg/dL — ABNORMAL HIGH (ref 70–99)
Potassium: 4.2 mmol/L (ref 3.5–5.1)
Sodium: 138 mmol/L (ref 135–145)

## 2023-08-01 LAB — RESP PANEL BY RT-PCR (RSV, FLU A&B, COVID)  RVPGX2
Influenza A by PCR: NEGATIVE
Influenza B by PCR: NEGATIVE
Resp Syncytial Virus by PCR: NEGATIVE
SARS Coronavirus 2 by RT PCR: NEGATIVE

## 2023-08-01 LAB — CBC WITH DIFFERENTIAL/PLATELET
Abs Immature Granulocytes: 0.02 10*3/uL (ref 0.00–0.07)
Basophils Absolute: 0 10*3/uL (ref 0.0–0.1)
Basophils Relative: 1 %
Eosinophils Absolute: 0.1 10*3/uL (ref 0.0–0.5)
Eosinophils Relative: 1 %
HCT: 42.9 % (ref 36.0–46.0)
Hemoglobin: 15.1 g/dL — ABNORMAL HIGH (ref 12.0–15.0)
Immature Granulocytes: 0 %
Lymphocytes Relative: 32 %
Lymphs Abs: 1.9 10*3/uL (ref 0.7–4.0)
MCH: 33 pg (ref 26.0–34.0)
MCHC: 35.2 g/dL (ref 30.0–36.0)
MCV: 93.7 fL (ref 80.0–100.0)
Monocytes Absolute: 0.6 10*3/uL (ref 0.1–1.0)
Monocytes Relative: 10 %
Neutro Abs: 3.2 10*3/uL (ref 1.7–7.7)
Neutrophils Relative %: 56 %
Platelets: 188 10*3/uL (ref 150–400)
RBC: 4.58 MIL/uL (ref 3.87–5.11)
RDW: 13.5 % (ref 11.5–15.5)
WBC: 5.7 10*3/uL (ref 4.0–10.5)
nRBC: 0 % (ref 0.0–0.2)

## 2023-08-01 LAB — CBG MONITORING, ED: Glucose-Capillary: 162 mg/dL — ABNORMAL HIGH (ref 70–99)

## 2023-08-01 MED ORDER — AMOXICILLIN-POT CLAVULANATE 875-125 MG PO TABS: 1.0000 | ORAL_TABLET | Freq: Two times a day (BID) | ORAL | 0 refills | Status: DC

## 2023-08-01 MED ORDER — BENZONATATE 200 MG PO CAPS
200.0000 mg | ORAL_CAPSULE | Freq: Three times a day (TID) | ORAL | 0 refills | Status: DC | PRN
Start: 2023-08-01 — End: 2023-08-26

## 2023-08-01 MED ORDER — AZITHROMYCIN 250 MG PO TABS
250.0000 mg | ORAL_TABLET | Freq: Every day | ORAL | 0 refills | Status: DC
Start: 2023-08-01 — End: 2023-08-22

## 2023-08-01 NOTE — ED Triage Notes (Signed)
 Pt states she is supposed to have an EGD on Wednesday this week and states she has been coughing with congestion since last Monday; pt is wanting to make sure she can still have the procedure due to the cough  Pt denies any current chest pain or fevers but stated she had some of those symptoms last Monday when they started   Pt states what she is coughing up is brown in color

## 2023-08-01 NOTE — Discharge Instructions (Signed)
 The antibiotics as directed until finished.  You have also been prescribed medication to help with your cough.  Drink plenty of fluids.  Follow-up with your primary care provider for recheck and also call your gastroenterologist office to discuss your upcoming procedure.  Return the emergency department if you develop any new or worsening symptoms.

## 2023-08-01 NOTE — Telephone Encounter (Signed)
 Pt called in and states she was diagnosed with pneumonia this morning at the ER. Pt has EGD on Wednesday. Should pt reschedule or is she ok to proceed? Please advise thank you

## 2023-08-01 NOTE — ED Provider Notes (Signed)
  EMERGENCY DEPARTMENT AT [facility] Provider Note   CSN: [id number] Arrival date & time: 08/01/23  4098     History {Add pertinent medical, surgical, social history, OB history to HPI:1} Chief Complaint  Patient presents with   Cough    Debra Khan is a 73 y.o. female.   Cough Associated symptoms: no chest pain, no chills, no ear pain, no fever, no headaches, no rash, no rhinorrhea, no shortness of breath, no sore throat and no wheezing        Debra Khan is a 73 y.o. female past medical history of Raynaud's, arthritis, GERD and neuromuscular disorder.  she presents to the Emergency Department requesting evaluation for chest congestion.  Symptoms have been present for 1 week.  She endorses intermittent cough mostly nonproductive.  Feels as though she has a "congestion in my chest" she is scheduled to have a EGD in 2 days.  States she is here wanting to make sure that she is okay to continue with the procedure.  She does states she had some chest tightness and subjective fever at onset 1 week ago but none since.  She continues to have productive cough with occasionally brown-colored sputum.  Home Medications Prior to Admission medications   Medication Sig Start Date End Date Taking? Authorizing Provider  Calcium -Magnesium-Vitamin D  (CALCIUM  MAGNESIUM PO) Take by mouth daily at 6 (six) AM.    [provider]  Cholecalciferol (D3 2000 PO) Take by mouth daily at 6 (six) AM.    [provider]  Collagen 500 MG CAPS Take 1 capsule by mouth daily.    [provider]  doxycycline (ADOXA) 50 MG tablet Take 50 mg by mouth daily.     [provider]  Multiple Vitamins-Minerals (EYE SUPPORT) TABS Take 1 tablet by mouth 2 (two) times daily.    [provider]  omeprazole  (PRILOSEC) 40 MG capsule Take 1 capsule (40 mg total) by mouth daily. 03/24/23   Castaneda Mayorga, Daniel, MD  OVER THE COUNTER MEDICATION Digestasassure  with meals.    [provider]      Allergies    Patient has no known allergies.    Review of Systems   Review of Systems  Constitutional:  Negative for appetite change, chills and fever.  HENT:  Positive for congestion. Negative for ear pain, rhinorrhea, sneezing, sore throat and trouble swallowing.   Respiratory:  Positive for cough. Negative for shortness of breath and wheezing.   Cardiovascular:  Negative for chest pain.  Gastrointestinal:  Negative for abdominal pain, diarrhea, nausea and vomiting.  Genitourinary:  Negative for dysuria.  Musculoskeletal:  Negative for back pain and neck pain.  Skin:  Negative for rash.  Neurological:  Negative for dizziness, syncope, weakness and headaches.    Physical Exam Updated Vital Signs BP 131/83 (BP Location: Left Arm)   Pulse 84   Temp 98.3 F (36.8 C) (Oral)   Resp 16   Ht 5\' 4"  (1.626 m)   Wt 65.8 kg   SpO2 93%   BMI 24.89 kg/m  Physical Exam Vitals and nursing note reviewed.  Constitutional:      General: She is not in acute distress.    Appearance: Normal appearance. She is not ill-appearing or toxic-appearing.  HENT:     Right Ear: Tympanic membrane and ear canal normal.     Left Ear: Tympanic membrane and ear canal normal.     Mouth/Throat:     Mouth: Mucous membranes  are moist.     Pharynx: Oropharynx is clear.  Eyes:     Conjunctiva/sclera: Conjunctivae normal.  Cardiovascular:     Rate and Rhythm: Normal rate and regular rhythm.     Pulses: Normal pulses.  Pulmonary:     Effort: Pulmonary effort is normal.  Abdominal:     Palpations: Abdomen is soft.     Tenderness: There is no abdominal tenderness.  Musculoskeletal:        General: Normal range of motion.     Cervical back: Normal range of motion.     Right lower leg: No edema.     Left lower leg: No edema.  Lymphadenopathy:     Cervical: No cervical adenopathy.  Skin:    General: Skin is warm.     Capillary Refill: Capillary refill takes  less than 2 seconds.  Neurological:     General: No focal deficit present.     Mental Status: She is alert.     Sensory: No sensory deficit.     Motor: No weakness.     ED Results / Procedures / Treatments   Labs (all labs ordered are listed, but only abnormal results are displayed) Labs Reviewed  RESP PANEL BY RT-PCR (RSV, FLU A&B, COVID)  RVPGX2  CBC WITH DIFFERENTIAL/PLATELET  BASIC METABOLIC PANEL WITH GFR    EKG None  Radiology No results found.  Procedures Procedures  {Document cardiac monitor, telemetry assessment procedure when appropriate:1}  Medications Ordered in ED Medications - No data to display  ED Course/ Medical Decision Making/ A&P   {   Click here for ABCD2, HEART and other calculatorsREFRESH Note before signing :1}                              Medical Decision Making Patient here for evaluation of chest congestion.  Symptoms for 1 week.  She is scheduled to have a EGD in 2 days.  Subjective fever at at onset now resolved.  Denies any chest pain or shortness of breath currently.   Patient well-appearing on exam.  She her O2 sat is slightly low.  Will recheck.  I do not appreciate any wheezing on my exam.  She has been off of her routine medications for 1 week due to her upcoming procedure.  She has history of GERD, symptoms possibly related to GERD exacerbation, URI, pneumonia, bronchitis.  Low clinical suspicion for PE or ACS.  Amount and/or Complexity of Data Reviewed Labs: ordered. Radiology: ordered.     {Document critical care time when appropriate:1} {Document review of labs and clinical decision tools ie heart score, Chads2Vasc2 etc:1}  {Document your independent review of radiology images, and any outside records:1} {Document your discussion with family members, caretakers, and with consultants:1} {Document social determinants of health affecting pt's care:1} {Document your decision making why or why not admission, treatments were  needed:1} Final Clinical Impression(s) / ED Diagnoses Final diagnoses:  None    Rx / DC Orders ED Discharge Orders     None
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
## 2023-08-02 NOTE — Telephone Encounter (Signed)
Agree with rescheduling

## 2023-08-02 NOTE — OR Nursing (Incomplete)
 Patient notified that procedure is canceled due to [place] having no water 

## 2023-08-08 ENCOUNTER — Telehealth: Payer: Self-pay | Admitting: *Deleted

## 2023-08-08 ENCOUNTER — Telehealth: Payer: Self-pay

## 2023-08-08 DIAGNOSIS — K219 Gastro-esophageal reflux disease without esophagitis: Secondary | ICD-10-CM

## 2023-08-08 NOTE — Progress Notes (Signed)
 Complex Care Management Note  Care Guide Note 08/08/2023 Name: JADELIN ENG MRN: [id number] DOB: 1950/08/30  VERNEICE CASPERS is a 73 y.o. year old female who sees Francenia Ingle, NP for primary care. I reached out to Argie Beets by phone today to offer complex care management services.  Ms. Freundlich was given information about Complex Care Management services today including:   The Complex Care Management services include support from the care team which includes your Nurse Care Manager, Clinical Social Worker, or Pharmacist.  The Complex Care Management team is here to help remove barriers to the health concerns and goals most important to you. Complex Care Management services are voluntary, and the patient may decline or stop services at any time by request to their care team member.   Complex Care Management Consent Status: Patient did not agree to participate in complex care management services at this time.  Encounter Outcome:  Patient Refused  Kandis Ormond, CMA [place]  [facility], [facility] Guide Direct Dial: [phone]  Fax: [phone] Website: [email or web address]

## 2023-08-11 ENCOUNTER — Encounter: Payer: Medicare Other | Admitting: Family Medicine
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
## 2023-08-22 ENCOUNTER — Ambulatory Visit (HOSPITAL_COMMUNITY): Admitting: Anesthesiology

## 2023-08-22 ENCOUNTER — Encounter (HOSPITAL_COMMUNITY): Payer: Self-pay | Admitting: Gastroenterology

## 2023-08-22 ENCOUNTER — Other Ambulatory Visit: Payer: Self-pay

## 2023-08-22 ENCOUNTER — Ambulatory Visit (HOSPITAL_COMMUNITY)
Admission: RE | Admit: 2023-08-22 | Discharge: 2023-08-22 | Disposition: A | Discharge status: Home / Self Care | Source: Ambulatory Visit | Attending: Gastroenterology | Admitting: Gastroenterology

## 2023-08-22 ENCOUNTER — Encounter (HOSPITAL_COMMUNITY)
Admission: RE | Disposition: A | Discharge status: Home / Self Care | Payer: Self-pay | Source: Ambulatory Visit | Attending: Gastroenterology

## 2023-08-22 DIAGNOSIS — K449 Diaphragmatic hernia without obstruction or gangrene: Secondary | ICD-10-CM

## 2023-08-22 DIAGNOSIS — K2970 Gastritis, unspecified, without bleeding: Secondary | ICD-10-CM | POA: Diagnosis not present

## 2023-08-22 DIAGNOSIS — K2100 Gastro-esophageal reflux disease with esophagitis, without bleeding: Secondary | ICD-10-CM | POA: Diagnosis not present

## 2023-08-22 DIAGNOSIS — R1310 Dysphagia, unspecified: Secondary | ICD-10-CM | POA: Insufficient documentation

## 2023-08-22 DIAGNOSIS — K2990 Gastroduodenitis, unspecified, without bleeding: Secondary | ICD-10-CM

## 2023-08-22 DIAGNOSIS — K209 Esophagitis, unspecified without bleeding: Secondary | ICD-10-CM

## 2023-08-22 DIAGNOSIS — K229 Disease of esophagus, unspecified: Secondary | ICD-10-CM | POA: Diagnosis not present

## 2023-08-22 DIAGNOSIS — K2289 Other specified disease of esophagus: Secondary | ICD-10-CM | POA: Diagnosis not present

## 2023-08-22 DIAGNOSIS — K219 Gastro-esophageal reflux disease without esophagitis: Secondary | ICD-10-CM | POA: Diagnosis not present

## 2023-08-22 HISTORY — PX: BRAVO PH STUDY: SHX5421

## 2023-08-22 HISTORY — PX: ESOPHAGOGASTRODUODENOSCOPY: SHX5428

## 2023-08-22 SURGERY — EGD (ESOPHAGOGASTRODUODENOSCOPY)
Anesthesia: General

## 2023-08-22 MED ORDER — LIDOCAINE 2% (20 MG/ML) 5 ML SYRINGE
INTRAMUSCULAR | Status: DC | PRN
  Administered 2023-08-22: 50 mg via INTRAVENOUS

## 2023-08-22 MED ORDER — PROPOFOL 500 MG/50ML IV EMUL
INTRAVENOUS | Status: DC | PRN
Start: 2023-08-22 — End: 2023-08-22
  Administered 2023-08-22: 150 ug/kg/min via INTRAVENOUS

## 2023-08-22 MED ORDER — LACTATED RINGERS IV SOLN: INTRAVENOUS | Status: DC

## 2023-08-22 MED ORDER — PROPOFOL 10 MG/ML IV BOLUS
INTRAVENOUS | Status: DC | PRN
  Administered 2023-08-22 (×2): 50 mg via INTRAVENOUS
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
## 2023-08-22 NOTE — Transfer of Care (Addendum)
 Immediate Anesthesia Transfer of Care Note  Patient: Debra Khan  Procedure(s) Performed: EGD (ESOPHAGOGASTRODUODENOSCOPY) PH MONITORING, ESOPHAGUS, WIRELESS  Patient Location: Endoscopy Unit  Anesthesia Type:General  Level of Consciousness: awake  Airway & Oxygen Therapy: Patient Spontanous Breathing  Post-op Assessment: Report given to RN  Post vital signs: Reviewed and stable  Last Vitals:  Vitals Value Taken Time  BP 111/70   Temp 36.5   Pulse 72   Resp 19   SpO2 95     Last Pain:  Vitals:   08/22/23 0916  TempSrc:   PainSc: 0-No pain      Patients Stated Pain Goal: 7 (08/22/23 0800)  Complications: No notable events documented.

## 2023-08-22 NOTE — Anesthesia Postprocedure Evaluation (Signed)
 Anesthesia Post Note  Patient: KARLI WICKIZER  Procedure(s) Performed: EGD (ESOPHAGOGASTRODUODENOSCOPY) PH MONITORING, ESOPHAGUS, WIRELESS  Patient location during evaluation: Endoscopy Anesthesia Type: General Level of consciousness: awake and alert Pain management: pain level controlled Vital Signs Assessment: post-procedure vital signs reviewed and stable Respiratory status: spontaneous breathing Cardiovascular status: blood pressure returned to baseline and stable Postop Assessment: no apparent nausea or vomiting Anesthetic complications: no   No notable events documented.   Last Vitals:  Vitals:   08/22/23 0940 08/22/23 0946  BP: (!) 83/53 111/70  Pulse: 72 72  Resp: 16 19  Temp: 36.5 C   SpO2: 95% 95%    Last Pain:  Vitals:   08/22/23 0946  TempSrc:   PainSc: 0-No pain                 Dolph Tavano

## 2023-08-22 NOTE — Progress Notes (Signed)
 BRAVO recorder use and instructions reviewed with patient and paperwork copies given.  Patient also watched informational video of BRAVO recorder use.

## 2023-08-22 NOTE — Anesthesia Preprocedure Evaluation (Signed)
 Anesthesia Evaluation  Patient identified by MRN, date of birth, ID band Patient awake    Reviewed: Allergy & Precautions, H&P , NPO status , Patient's Chart, lab work & pertinent test results, reviewed documented beta blocker date and time   Airway Mallampati: II  TM Distance: >3 FB Neck ROM: full    Dental no notable dental hx.    Pulmonary neg pulmonary ROS   Pulmonary exam normal breath sounds clear to auscultation       Cardiovascular Exercise Tolerance: Good hypertension, negative cardio ROS  Rhythm:regular Rate:Normal     Neuro/Psych  Neuromuscular disease negative neurological ROS  negative psych ROS   GI/Hepatic negative GI ROS, Neg liver ROS,GERD  ,,  Endo/Other  negative endocrine ROS    Renal/GU Renal diseasenegative Renal ROS  negative genitourinary   Musculoskeletal  (+) Arthritis ,    Abdominal   Peds  Hematology negative hematology ROS (+)   Anesthesia Other Findings   Reproductive/Obstetrics negative OB ROS                             Anesthesia Physical Anesthesia Plan  ASA: 2  Anesthesia Plan: General   Post-op Pain Management:    Induction:   PONV Risk Score and Plan: Propofol  infusion  Airway Management Planned:   Additional Equipment:   Intra-op Plan:   Post-operative Plan:   Informed Consent: I have reviewed the patients History and Physical, chart, labs and discussed the procedure including the risks, benefits and alternatives for the proposed anesthesia with the patient or authorized representative who has indicated his/her understanding and acceptance.     Dental Advisory Given  Plan Discussed with: CRNA  Anesthesia Plan Comments:        Anesthesia Quick Evaluation

## 2023-08-22 NOTE — Discharge Instructions (Signed)

## 2023-08-22 NOTE — Op Note (Signed)
 [facility] Patient Name: Debra Khan Procedure Date: 08/22/2023 9:03 AM MRN: [id number] Date of Birth: [birth year] Attending MD: Terril Fetters , MD, [id number] CSN: [id number] Age: 73 Admit Type: Outpatient Procedure:                Upper GI endoscopy Indications:              Dysphagia, Esophageal reflux symptoms that persist                            despite appropriate therapy, Pre c-TIF assessement                            for objective data of GERD with wireless pH                            monitoring Providers:                Terril Fetters, MD, Willena Harp, Annell Barrow Referring MD:              Medicines:                Monitored Anesthesia Care Complications:            No immediate complications. Estimated Blood Loss:     Estimated blood loss was minimal. Procedure:                Pre-Anesthesia Assessment:                           - Prior to the procedure, a History and Physical                            was performed, and patient medications and                            allergies were reviewed. [name] tolerance of                            previous anesthesia was also reviewed. The risks                            and benefits of the procedure and the sedation                            options and risks were discussed with the patient.                            All questions were answered, and informed consent                            was obtained. Prior Anticoagulants: The patient has                            taken no anticoagulant  or antiplatelet agents. ASA                            Grade Assessment: II - A patient with mild systemic                            disease. After reviewing the risks and benefits,                            the patient was deemed in satisfactory condition to                            undergo the procedure.                           After obtaining informed consent, the  endoscope was                            passed under direct vision. Throughout the                            procedure, the patient's blood pressure, pulse, and                            oxygen saturations were monitored continuously. The                            GIF-H190 ([id number]) scope was introduced through the                            mouth, and advanced to the second part of duodenum.                            The upper GI endoscopy was accomplished without                            difficulty. The patient tolerated the procedure                            well. Scope In: 9:19:03 AM Scope Out: 9:31:40 AM Total Procedure Duration: 0 hours 12 minutes 37 seconds  Findings:      LA Grade B (one or more mucosal breaks greater than 5 mm, not extending       between the tops of two mucosal folds) esophagitis with no bleeding was       found in the lower third of the esophagus. Biopsies were taken with a       cold forceps for histology. The BRAVO capsule with delivery system was       introduced through the mouth and advanced into the esophagus, such that       the BRAVO pH capsule was positioned 30 cm from the incisors, which was 6       cm proximal to the GE junction. The BRAVO pH capsule was then deployed       and attached to the esophageal mucosa. The delivery  system was then       withdrawn. Endoscopy was utilized for probe placement and diagnostic       evaluation.      Esophagogastric landmarks were identified: the Z-line was found at 34 cm       and the site of hiatal narrowing was found at 38 cm from the incisors.      A 4 cm hiatal hernia was present.      The gastroesophageal flap valve was visualized endoscopically and       classified as Hill Grade III (minimal fold, loose to endoscope, hiatal       hernia likely).      Mild inflammation characterized by erosions was found in the stomach.       Biopsies were taken with a cold forceps for histology.      The  duodenal bulb and second portion of the duodenum were normal. Impression:               - LA Grade B reflux esophagitis with no bleeding.                            Biopsied.                           - Esophagogastric landmarks identified.                           - 4 cm hiatal hernia.                           - Gastroesophageal flap valve classified as Hill                            Grade III (minimal fold, loose to endoscope, hiatal                            hernia likely).                           - Gastritis. Biopsied.                           - Normal duodenal bulb and second portion of the                            duodenum.                           - The BRAVO pH capsule was deployed. Moderate Sedation:      Per Anesthesia Care Recommendation:           - Patient has a contact number available for                            emergencies. The signs and symptoms of potential                            delayed complications were discussed with the  patient. Return to normal activities tomorrow.                            Written discharge instructions were provided to the                            patient.                           - Resume previous diet.                           - Continue present medications.                           - Await pathology results.                           -BRAVO instruction ( OFF PPI) Procedure Code(s):        --- Professional ---                           [phone], Esophagogastroduodenoscopy, flexible,                            transoral; with biopsy, single or multiple Diagnosis Code(s):        --- Professional ---                           K21.00, Gastro-esophageal reflux disease with                            esophagitis, without bleeding                           K44.9, Diaphragmatic hernia without obstruction or                            gangrene                           K29.70, Gastritis, unspecified,  without bleeding                           R13.10, Dysphagia, unspecified CPT copyright 2022 American Medical Association. All rights reserved. The codes documented in this report are preliminary and upon coder review may  be revised to meet current compliance requirements. Terril Fetters, MD Terril Fetters, MD 08/22/2023 9:41:08 AM This report has been signed electronically. Number of Addenda: 0

## 2023-08-23 ENCOUNTER — Encounter (HOSPITAL_COMMUNITY): Payer: Self-pay | Admitting: Gastroenterology

## 2023-08-30 DIAGNOSIS — L719 Rosacea, unspecified: Secondary | ICD-10-CM | POA: Diagnosis not present

## 2023-09-02 ENCOUNTER — Encounter (HOSPITAL_COMMUNITY)
Admission: RE | Admit: 2023-09-02 | Discharge: 2023-09-02 | Disposition: A | Discharge status: Home / Self Care | Source: Ambulatory Visit | Attending: General Surgery | Admitting: General Surgery

## 2023-09-02 ENCOUNTER — Other Ambulatory Visit: Payer: Self-pay

## 2023-09-02 ENCOUNTER — Encounter (HOSPITAL_COMMUNITY): Payer: Self-pay

## 2023-09-05 ENCOUNTER — Ambulatory Visit (INDEPENDENT_AMBULATORY_CARE_PROVIDER_SITE_OTHER): Payer: Self-pay | Admitting: Gastroenterology

## 2023-09-05 NOTE — Procedures (Signed)
 BRAVO and Path report   Pathology :  A. GASTRIC, BIOPSY:  -Gastric mucosa with no specific pathologic diagnosis.  -No inflammatory pattern predictive of Helicobacter pylori infection  identified.  -Negative for intestinal metaplasia and malignancy.   B. ESOPHAGEAL, BIOPSY:  -Squamous hyperplasia with reactive changes consistent with reflux  effect (basal hyperplasia and elongated papillae).  - No intrasquamous eosinophils identified.  - Negative for dysplasia and malignancy.  - No columnar (glandular) mucosa present.   BRAVO Report    Procedure Description  The study was performed with the BRAVO pH capsule telemetry system. A routine upper endoscopy was performed and then BRAVO wireless pH telemetry probe was placed under endoscopic guidance 6 cm proximal to the Z line. The patient was then discharged home and kept a symptom diary for 96HRs, afterwhich the wireless telemetry receiver and diary were brought back to the endoscopy unit. The information was then downloaded and analyzed.     Indications  Refractory GERD     Interpretations   This was an abnormal study performed off of acid suppressive therapy. There was significantly increased esophageal acid exposure on Day 2 (25.4%) and  Day 4 (10.9%) but physiological on Day 1 (4.6%) and Day 3 (4.8%) . Total Acid exposure time (AET) is elevated at 11.8. The DeMeester Score was elevated at 52.1 for all days combined. There was good symptom correlation for symptoms of heartburn  with SAP 97.9.This is a positive study given 2/4 days with elevated AET with [place] consensus 2.0 . 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  Clinical correlation is recommended.     Patient has evidence of acid mediated GERD in setting of Hiatal hernia  ; endoscopic evidence with LA Class B esophagitis , histologically with basal hyperplasia and elongated papillae and a positive BRAVO study (AET>6.0)  Patient will follow up with Dr Sammi Crick and is  scheduled for cTIF on 6/18  Spoke to the patient over the phone and verbalizes understanding

## 2023-09-07 ENCOUNTER — Encounter (HOSPITAL_COMMUNITY): Payer: Self-pay | Admitting: Gastroenterology

## 2023-09-07 ENCOUNTER — Encounter (HOSPITAL_COMMUNITY)
Admission: RE | Disposition: A | Discharge status: Home / Self Care | Payer: Self-pay | Source: Ambulatory Visit | Attending: Gastroenterology

## 2023-09-07 ENCOUNTER — Encounter (HOSPITAL_COMMUNITY)
Admission: RE | Disposition: A | Discharge status: Home / Self Care | Payer: Self-pay | Source: Ambulatory Visit | Attending: General Surgery

## 2023-09-07 ENCOUNTER — Encounter: Payer: Medicare Other | Admitting: Family Medicine

## 2023-09-07 ENCOUNTER — Observation Stay (HOSPITAL_COMMUNITY)
Admission: RE | Admit: 2023-09-07 | Discharge: 2023-09-08 | Disposition: A | Discharge status: Home / Self Care | Source: Ambulatory Visit | Attending: Gastroenterology | Admitting: Gastroenterology

## 2023-09-07 ENCOUNTER — Other Ambulatory Visit (INDEPENDENT_AMBULATORY_CARE_PROVIDER_SITE_OTHER): Payer: Self-pay | Admitting: Gastroenterology

## 2023-09-07 ENCOUNTER — Ambulatory Visit (HOSPITAL_COMMUNITY): Admitting: Anesthesiology

## 2023-09-07 ENCOUNTER — Encounter (INDEPENDENT_AMBULATORY_CARE_PROVIDER_SITE_OTHER): Payer: Self-pay

## 2023-09-07 ENCOUNTER — Other Ambulatory Visit: Payer: Self-pay

## 2023-09-07 DIAGNOSIS — K219 Gastro-esophageal reflux disease without esophagitis: Secondary | ICD-10-CM

## 2023-09-07 DIAGNOSIS — Z9889 Other specified postprocedural states: Principal | ICD-10-CM | POA: Diagnosis present

## 2023-09-07 DIAGNOSIS — K2100 Gastro-esophageal reflux disease with esophagitis, without bleeding: Secondary | ICD-10-CM | POA: Diagnosis not present

## 2023-09-07 DIAGNOSIS — K449 Diaphragmatic hernia without obstruction or gangrene: Principal | ICD-10-CM | POA: Insufficient documentation

## 2023-09-07 HISTORY — PX: XI ROBOTIC ASSISTED PARAESOPHAGEAL HERNIA REPAIR: SHX6871

## 2023-09-07 HISTORY — PX: TRANSORAL INCISIONLESS FUNDOPLICATION: SHX6840

## 2023-09-07 LAB — BASIC METABOLIC PANEL WITH GFR
Anion gap: 12 (ref 5–15)
BUN: 11 mg/dL (ref 8–23)
CO2: 24 mmol/L (ref 22–32)
Calcium: 9.4 mg/dL (ref 8.9–10.3)
Chloride: 103 mmol/L (ref 98–111)
Creatinine, Ser: 0.73 mg/dL (ref 0.44–1.00)
GFR, Estimated: >60 mL/min (ref >60)
Glucose, Bld: 178 mg/dL — ABNORMAL HIGH (ref 70–99)
Potassium: 3.8 mmol/L (ref 3.5–5.1)
Sodium: 139 mmol/L (ref 135–145)

## 2023-09-07 LAB — CBC
HCT: 45.3 % (ref 36.0–46.0)
Hemoglobin: 15.8 g/dL — ABNORMAL HIGH (ref 12.0–15.0)
MCH: 33.7 pg (ref 26.0–34.0)
MCHC: 34.9 g/dL (ref 30.0–36.0)
MCV: 96.6 fL (ref 80.0–100.0)
Platelets: 233 10*3/uL (ref 150–400)
RBC: 4.69 MIL/uL (ref 3.87–5.11)
RDW: 13.7 % (ref 11.5–15.5)
WBC: 10.9 10*3/uL — ABNORMAL HIGH (ref 4.0–10.5)
nRBC: 0 % (ref 0.0–0.2)

## 2023-09-07 LAB — MAGNESIUM: Magnesium: 2.1 mg/dL (ref 1.7–2.4)

## 2023-09-07 SURGERY — FUNDOPLICATION, STOMACH, INCISIONLESS, ORAL APPROACH
Anesthesia: General

## 2023-09-07 SURGERY — REPAIR, HERNIA, PARAESOPHAGEAL, ROBOT-ASSISTED
Anesthesia: General | Site: Abdomen

## 2023-09-07 MED ORDER — SCOPOLAMINE 1 MG/3DAYS TD PT72
MEDICATED_PATCH | TRANSDERMAL | Status: DC | PRN
  Administered 2023-09-07: 1 via TRANSDERMAL

## 2023-09-07 MED ORDER — ROCURONIUM BROMIDE 10 MG/ML (PF) SYRINGE
PREFILLED_SYRINGE | INTRAVENOUS | Status: DC | PRN
Start: 2023-09-07 — End: 2023-09-07
  Administered 2023-09-07: 80 mg via INTRAVENOUS
  Administered 2023-09-07: 20 mg via INTRAVENOUS

## 2023-09-07 MED ORDER — ENOXAPARIN SODIUM 40 MG/0.4ML IJ SOSY: 40.0000 mg | PREFILLED_SYRINGE | INTRAMUSCULAR | Status: DC

## 2023-09-07 MED ORDER — ENOXAPARIN SODIUM 40 MG/0.4ML IJ SOSY
40.0000 mg | PREFILLED_SYRINGE | INTRAMUSCULAR | Status: DC
  Administered 2023-09-08: 40 mg via SUBCUTANEOUS
  Filled 2023-09-07: qty 0.4

## 2023-09-07 MED ORDER — AMOXICILLIN-POT CLAVULANATE 875-125 MG PO TABS
1.0000 | ORAL_TABLET | Freq: Two times a day (BID) | ORAL | Status: DC
  Administered 2023-09-07 – 2023-09-08 (×2): 1 via ORAL
  Filled 2023-09-07 (×4): qty 1

## 2023-09-07 MED ORDER — EPHEDRINE 5 MG/ML INJ
INTRAVENOUS | Status: AC
Start: 2023-09-07 — End: 2023-09-07
  Filled 2023-09-07: qty 5

## 2023-09-07 MED ORDER — PHENYLEPHRINE 80 MCG/ML (10ML) SYRINGE FOR IV PUSH (FOR BLOOD PRESSURE SUPPORT)
PREFILLED_SYRINGE | INTRAVENOUS | Status: AC
  Filled 2023-09-07: qty 10

## 2023-09-07 MED ORDER — KETOROLAC TROMETHAMINE 15 MG/ML IJ SOLN
15.0000 mg | Freq: Once | INTRAMUSCULAR | Status: AC
  Administered 2023-09-07: 15 mg via INTRAVENOUS
  Filled 2023-09-07: qty 1

## 2023-09-07 MED ORDER — ACETAMINOPHEN 325 MG PO TABS: 650.0000 mg | ORAL_TABLET | Freq: Four times a day (QID) | ORAL | Status: DC | PRN

## 2023-09-07 MED ORDER — PANTOPRAZOLE SODIUM 40 MG IV SOLR
40.0000 mg | Freq: Two times a day (BID) | INTRAVENOUS | Status: DC
  Administered 2023-09-07 – 2023-09-08 (×2): 40 mg via INTRAVENOUS
  Filled 2023-09-07 (×4): qty 10

## 2023-09-07 MED ORDER — MENTHOL 3 MG MT LOZG
1.0000 | LOZENGE | OROMUCOSAL | Status: DC | PRN
  Administered 2023-09-07: 3 mg via ORAL
  Filled 2023-09-07: qty 9

## 2023-09-07 MED ORDER — LIDOCAINE 2% (20 MG/ML) 5 ML SYRINGE
INTRAMUSCULAR | Status: DC | PRN
  Administered 2023-09-07: 80 mg via INTRAVENOUS

## 2023-09-07 MED ORDER — BUPIVACAINE HCL (PF) 0.5 % IJ SOLN
INTRAMUSCULAR | Status: AC
Start: 2023-09-07 — End: 2023-09-07
  Filled 2023-09-07: qty 30

## 2023-09-07 MED ORDER — ONDANSETRON HCL 4 MG/2ML IJ SOLN
4.0000 mg | Freq: Once | INTRAMUSCULAR | Status: AC
  Administered 2023-09-07: 4 mg via INTRAVENOUS
  Filled 2023-09-07: qty 2

## 2023-09-07 MED ORDER — FENTANYL CITRATE (PF) 100 MCG/2ML IJ SOLN
INTRAMUSCULAR | Status: DC | PRN
  Administered 2023-09-07 (×2): 50 ug via INTRAVENOUS

## 2023-09-07 MED ORDER — PROPOFOL 10 MG/ML IV BOLUS
INTRAVENOUS | Status: DC | PRN
Start: 2023-09-07 — End: 2023-09-07
  Administered 2023-09-07: 10 ug/kg/min via INTRAVENOUS
  Administered 2023-09-07: 150 mg via INTRAVENOUS

## 2023-09-07 MED ORDER — SCOPOLAMINE 1 MG/3DAYS TD PT72
MEDICATED_PATCH | TRANSDERMAL | Status: AC
  Filled 2023-09-07: qty 1

## 2023-09-07 MED ORDER — CEFAZOLIN SODIUM-DEXTROSE 2-4 GM/100ML-% IV SOLN
2.0000 g | INTRAVENOUS | Status: AC
  Administered 2023-09-07: 2 g via INTRAVENOUS
  Filled 2023-09-07: qty 100

## 2023-09-07 MED ORDER — ACETAMINOPHEN 650 MG RE SUPP: 650.0000 mg | Freq: Four times a day (QID) | RECTAL | Status: DC | PRN

## 2023-09-07 MED ORDER — DIPHENHYDRAMINE HCL 50 MG/ML IJ SOLN
INTRAMUSCULAR | Status: AC
  Filled 2023-09-07: qty 1

## 2023-09-07 MED ORDER — ROCURONIUM BROMIDE 10 MG/ML (PF) SYRINGE
PREFILLED_SYRINGE | INTRAVENOUS | Status: AC
Start: 2023-09-07 — End: 2023-09-07
  Filled 2023-09-07: qty 10

## 2023-09-07 MED ORDER — FENTANYL CITRATE PF 50 MCG/ML IJ SOSY: 25.0000 ug | PREFILLED_SYRINGE | INTRAMUSCULAR | Status: DC | PRN

## 2023-09-07 MED ORDER — SUGAMMADEX SODIUM 200 MG/2ML IV SOLN
INTRAVENOUS | Status: DC | PRN
  Administered 2023-09-07: 200 mg via INTRAVENOUS

## 2023-09-07 MED ORDER — PHENYLEPHRINE 80 MCG/ML (10ML) SYRINGE FOR IV PUSH (FOR BLOOD PRESSURE SUPPORT)
PREFILLED_SYRINGE | INTRAVENOUS | Status: DC | PRN
Start: 2023-09-07 — End: 2023-09-07
  Administered 2023-09-07 (×3): 80 ug via INTRAVENOUS

## 2023-09-07 MED ORDER — ONDANSETRON HCL 4 MG/2ML IJ SOLN
INTRAMUSCULAR | Status: AC
  Filled 2023-09-07: qty 2

## 2023-09-07 MED ORDER — ONDANSETRON HCL 4 MG/2ML IJ SOLN
4.0000 mg | Freq: Four times a day (QID) | INTRAMUSCULAR | Status: DC | PRN
  Administered 2023-09-07: 4 mg via INTRAVENOUS
  Filled 2023-09-07: qty 2

## 2023-09-07 MED ORDER — OXYCODONE HCL 5 MG/5ML PO SOLN: 5.0000 mg | Freq: Once | ORAL | Status: DC | PRN

## 2023-09-07 MED ORDER — DIPHENHYDRAMINE HCL 50 MG/ML IJ SOLN
INTRAMUSCULAR | Status: AC
Start: 2023-09-07 — End: 2023-09-07
  Filled 2023-09-07: qty 1

## 2023-09-07 MED ORDER — PROPOFOL 10 MG/ML IV BOLUS
INTRAVENOUS | Status: AC
Start: 2023-09-07 — End: 2023-09-07
  Filled 2023-09-07: qty 20

## 2023-09-07 MED ORDER — FENTANYL CITRATE (PF) 100 MCG/2ML IJ SOLN
INTRAMUSCULAR | Status: AC
  Filled 2023-09-07: qty 2

## 2023-09-07 MED ORDER — DEXMEDETOMIDINE HCL IN NACL 80 MCG/20ML IV SOLN
INTRAVENOUS | Status: DC | PRN
  Administered 2023-09-07: 12 ug via INTRAVENOUS

## 2023-09-07 MED ORDER — MORPHINE SULFATE (PF) 2 MG/ML IV SOLN: 2.0000 mg | INTRAVENOUS | Status: DC | PRN

## 2023-09-07 MED ORDER — BUPIVACAINE HCL (PF) 0.5 % IJ SOLN
INTRAMUSCULAR | Status: DC | PRN
  Administered 2023-09-07: 30 mL

## 2023-09-07 MED ORDER — 0.9 % SODIUM CHLORIDE (POUR BTL) OPTIME
TOPICAL | Status: DC | PRN
  Administered 2023-09-07: 500 mL

## 2023-09-07 MED ORDER — MAGIC MOUTHWASH W/LIDOCAINE
5.0000 mL | Freq: Four times a day (QID) | ORAL | Status: DC | PRN
  Administered 2023-09-08: 5 mL via ORAL
  Filled 2023-09-07 (×3): qty 5

## 2023-09-07 MED ORDER — CHLORHEXIDINE GLUCONATE CLOTH 2 % EX PADS: 6.0000 | MEDICATED_PAD | Freq: Once | CUTANEOUS | Status: DC

## 2023-09-07 MED ORDER — LACTATED RINGERS IV SOLN: INTRAVENOUS | Status: DC | PRN

## 2023-09-07 MED ORDER — CHLORHEXIDINE GLUCONATE 0.12 % MT SOLN
15.0000 mL | Freq: Once | OROMUCOSAL | Status: AC
  Administered 2023-09-07: 15 mL via OROMUCOSAL

## 2023-09-07 MED ORDER — LACTATED RINGERS IV SOLN: INTRAVENOUS | Status: DC

## 2023-09-07 MED ORDER — MINERAL OIL LIGHT OIL
TOPICAL_OIL | Status: DC | PRN
  Administered 2023-09-07: 1 via TOPICAL

## 2023-09-07 MED ORDER — EPHEDRINE SULFATE-NACL 50-0.9 MG/10ML-% IV SOSY
PREFILLED_SYRINGE | INTRAVENOUS | Status: DC | PRN
  Administered 2023-09-07: 5 mg via INTRAVENOUS

## 2023-09-07 MED ORDER — DEXAMETHASONE SODIUM PHOSPHATE 10 MG/ML IJ SOLN
INTRAMUSCULAR | Status: DC | PRN
  Administered 2023-09-07: 10 mg via INTRAVENOUS

## 2023-09-07 MED ORDER — DEXAMETHASONE SODIUM PHOSPHATE 10 MG/ML IJ SOLN
INTRAMUSCULAR | Status: AC
  Filled 2023-09-07: qty 1

## 2023-09-07 MED ORDER — SODIUM CHLORIDE 0.9 % IV SOLN: 25.0000 mg | Freq: Three times a day (TID) | INTRAVENOUS | Status: DC | PRN

## 2023-09-07 MED ORDER — ACETAMINOPHEN 10 MG/ML IV SOLN
1000.0000 mg | Freq: Once | INTRAVENOUS | Status: AC
  Administered 2023-09-07: 1000 mg via INTRAVENOUS
  Filled 2023-09-07: qty 100

## 2023-09-07 MED ORDER — MINERAL OIL PO OIL
TOPICAL_OIL | ORAL | Status: AC
  Filled 2023-09-07: qty 30

## 2023-09-07 MED ORDER — OXYCODONE HCL 5 MG PO TABS: 5.0000 mg | ORAL_TABLET | Freq: Once | ORAL | Status: DC | PRN

## 2023-09-07 MED ORDER — ONDANSETRON HCL 4 MG/2ML IJ SOLN
INTRAMUSCULAR | Status: DC | PRN
  Administered 2023-09-07: 4 mg via INTRAVENOUS

## 2023-09-07 MED ORDER — ONDANSETRON HCL 4 MG/2ML IJ SOLN: 4.0000 mg | Freq: Once | INTRAMUSCULAR | Status: DC | PRN

## 2023-09-07 MED ORDER — DIPHENHYDRAMINE HCL 50 MG/ML IJ SOLN
INTRAMUSCULAR | Status: DC | PRN
  Administered 2023-09-07: 12.5 mg via INTRAVENOUS

## 2023-09-07 SURGICAL SUPPLY — 37 items
CHLORAPREP W/TINT 26 (MISCELLANEOUS) ×2 IMPLANT
COVER LIGHT HANDLE (MISCELLANEOUS) IMPLANT
COVER MAYO STAND XLG (MISCELLANEOUS) ×2 IMPLANT
DERMABOND ADVANCED .7 DNX12 (GAUZE/BANDAGES/DRESSINGS) ×2 IMPLANT
DRAPE ARM DVNC X/XI (DISPOSABLE) ×8 IMPLANT
DRAPE COLUMN DVNC XI (DISPOSABLE) ×2 IMPLANT
DRIVER NDL MEGA SUTCUT DVNCXI (INSTRUMENTS) ×2 IMPLANT
DRIVER NDLE MEGA SUTCUT DVNCXI (INSTRUMENTS) ×2 IMPLANT
ELECTRODE REM PT RTRN 9FT ADLT (ELECTROSURGICAL) ×2 IMPLANT
FORCEPS BPLR 8 MD DVNC XI (FORCEP) ×2 IMPLANT
GLOVE BIOGEL PI IND STRL 7.0 (GLOVE) ×4 IMPLANT
GLOVE SURG SS PI 7.5 STRL IVOR (GLOVE) ×4 IMPLANT
GOWN STRL REUS W/TWL LRG LVL3 (GOWN DISPOSABLE) ×6 IMPLANT
GRASPER TIP-UP FEN DVNC XI (INSTRUMENTS) ×2 IMPLANT
IRRIGATOR SUCT 8 DISP DVNC XI (IRRIGATION / IRRIGATOR) IMPLANT
KIT TURNOVER KIT A (KITS) ×2 IMPLANT
MANIFOLD NEPTUNE II (INSTRUMENTS) ×2 IMPLANT
NDL HYPO 25X1 1.5 SAFETY (NEEDLE) ×2 IMPLANT
NDL INSUFFLATION 14GA 120MM (NEEDLE) ×2 IMPLANT
NEEDLE HYPO 25X1 1.5 SAFETY (NEEDLE) ×2 IMPLANT
NEEDLE INSUFFLATION 14GA 120MM (NEEDLE) ×2 IMPLANT
OBTURATOR OPTICALSTD 8 DVNC (TROCAR) ×2 IMPLANT
PACK LAP CHOLE LZT030E (CUSTOM PROCEDURE TRAY) ×2 IMPLANT
PAD ARMBOARD POSITIONER FOAM (MISCELLANEOUS) ×2 IMPLANT
PENCIL HANDSWITCHING (ELECTRODE) ×2 IMPLANT
POSITIONER HEAD 8X9X4 ADT (SOFTGOODS) ×2 IMPLANT
RETRACTOR LAPSCP 12X46 CVD (ENDOMECHANICALS) ×2 IMPLANT
SEAL UNIV 5-12 XI (MISCELLANEOUS) ×8 IMPLANT
SEALER VESSEL EXT DVNC XI (MISCELLANEOUS) ×2 IMPLANT
SET BASIN LINEN APH (SET/KITS/TRAYS/PACK) ×2 IMPLANT
SET TUBE SMOKE EVAC HIGH FLOW (TUBING) ×2 IMPLANT
SUT ETHIBOND 0 36 GRN (SUTURE) IMPLANT
SUT MNCRL AB 4-0 PS2 18 (SUTURE) ×2 IMPLANT
SUT VICRYL 0 UR6 27IN ABS (SUTURE) ×2 IMPLANT
SYR 30ML LL (SYRINGE) ×2 IMPLANT
TROCAR Z-THRD FIOS HNDL 11X100 (TROCAR) ×2 IMPLANT
WATER STERILE IRR 500ML POUR (IV SOLUTION) ×2 IMPLANT

## 2023-09-07 NOTE — Anesthesia Preprocedure Evaluation (Signed)
 Anesthesia Evaluation  Patient identified by MRN, date of birth, ID band Patient awake    Reviewed: Allergy & Precautions, H&P , NPO status , Patient's Chart, lab work & pertinent test results, reviewed documented beta blocker date and time   Airway Mallampati: II  TM Distance: >3 FB Neck ROM: full    Dental no notable dental hx.    Pulmonary neg pulmonary ROS   Pulmonary exam normal breath sounds clear to auscultation       Cardiovascular Exercise Tolerance: Good hypertension, negative cardio ROS  Rhythm:regular Rate:Normal     Neuro/Psych negative neurological ROS  negative psych ROS   GI/Hepatic Neg liver ROS,GERD  ,,  Endo/Other  negative endocrine ROS    Renal/GU Renal disease  negative genitourinary   Musculoskeletal   Abdominal   Peds  Hematology negative hematology ROS (+)   Anesthesia Other Findings   Reproductive/Obstetrics negative OB ROS                             Anesthesia Physical Anesthesia Plan  ASA: 2  Anesthesia Plan: General and General ETT   Post-op Pain Management:    Induction:   PONV Risk Score and Plan: Ondansetron  and Scopolamine patch - Pre-op  Airway Management Planned:   Additional Equipment:   Intra-op Plan:   Post-operative Plan:   Informed Consent: I have reviewed the patients History and Physical, chart, labs and discussed the procedure including the risks, benefits and alternatives for the proposed anesthesia with the patient or authorized representative who has indicated his/her understanding and acceptance.     Dental Advisory Given  Plan Discussed with: CRNA  Anesthesia Plan Comments:        Anesthesia Quick Evaluation

## 2023-09-07 NOTE — Interval H&P Note (Signed)
 History and Physical Interval Note:  09/07/2023 11:14 AM  Debra Khan  has presented today for surgery, with the diagnosis of PARAESOPHAGELA HERNIA GERD.  The various methods of treatment have been discussed with the patient and family. After consideration of risks, benefits and other options for treatment, the patient has consented to  Procedure(s): REPAIR, HERNIA, PARAESOPHAGEAL, ROBOT-ASSISTED (N/A) FUNDOPLICATION, STOMACH, INCISIONLESS, ORAL APPROACH (N/A) as a surgical intervention.  [name] history has been reviewed, patient examined, no change in status, stable for surgery.  I have reviewed the patient's chart and labs.  Questions were answered to the patient's satisfaction.     Alanda Allegra

## 2023-09-07 NOTE — Op Note (Signed)
 Patient:  Debra Khan  DOB:  [birth year]  MRN:  [id number]   Preop Diagnosis: GERD, sliding hiatal hernia  Postop Diagnosis: Same  Procedure: Robotic assisted laparoscopic paraesophageal herniorrhaphy  Surgeon: Alanda Allegra, MD  Anes: General endotracheal  Indications: Patient is a 73 year old white female with a history of intractable GERD along with a sliding hiatal hernia.  She now presents for robotic assisted laparoscopic paraesophageal herniorrhaphy.  She will undergo a TIF procedure by Dr. Sammi Crick under the same anesthetic.  The risks and benefits of the procedure including bleeding, infection, cardiopulmonary difficulties, and gastrointestinal perforation were fully explained to the patient, who gave informed consent.  Procedure note: The patient was placed in the supine position.  After induction of general endotracheal anesthesia, the abdomen was prepped and draped using the usual sterile technique with ChloraPrep.  Surgical site confirmation was performed.  A supraumbilical incision was made down to the fascia.  A Veress needle was introduced into the abdominal cavity and confirmation of placement was done using the saline drop test.  The abdomen was then insufflated to 15 mmHg pressure.  An 8 mm trocar was introduced into the abdominal cavity under direct visualization without difficulty.  Patient was placed in reverse Trendelenburg position and additional 8 mm trocars were placed in the left flank, left upper quadrant, and right upper quadrant regions.  A 12 mm trocar was placed in the right flank region.  The robot was then docked and targeted.  A paddle liver retractor was placed up towards the hiatus.  The gastrohepatic ligament was divided using the vessel sealer.  This was taken up to the hiatus.  An approximately 3 cm hiatal hernia was noted.  The right crura was fully identified.  The dissection was then carried posterior to the gastroesophageal juncture until the left  crura was identified.  Once this was done, 0 Ethibond sutures x 2 were placed posteriorly.  A tension-free repair was noted.  The area was inspected and no abnormal bleeding or evidence of gastroesophageal perforation was noted.  The paddle tractor was then removed.  The robot was then undocked and all air was evacuated from the abdominal cavity prior to removal of the trocars.  All wounds were irrigated with normal saline.  All wounds were injected with 0.5% Sensorcaine.  The right upper quadrant and midline fascia was reapproximated using 0 Vicryl interrupted sutures.  All skin incisions were closed using a 4-0 Monocryl subcuticular suture.  Dermabond was applied.  All tape and needle counts were correct at the end of the procedure.  Dr. Castaneda then took over to perform the TIF procedure.  Complications: None  EBL: Minimal  Specimen: None

## 2023-09-07 NOTE — Anesthesia Procedure Notes (Signed)
 Procedure Name: Intubation Date/Time: 09/07/2023 12:31 PM  Performed by: Coretha Dew, MDPre-anesthesia Checklist: Patient identified, Emergency Drugs available, Suction available and Patient being monitored Patient Re-evaluated:Patient Re-evaluated prior to induction Oxygen Delivery Method: Circle system utilized Preoxygenation: Pre-oxygenation with 100% oxygen Induction Type: IV induction Ventilation: Mask ventilation without difficulty Laryngoscope Size: Mac and 3 Grade View: Grade I Tube type: Oral Number of attempts: 1 Airway Equipment and Method: Stylet Placement Confirmation: ETT inserted through vocal cords under direct vision, positive ETCO2, CO2 detector and breath sounds checked- equal and bilateral Secured at: 22 cm Tube secured with: Tape Dental Injury: Teeth and Oropharynx as per pre-operative assessment
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
## 2023-09-07 NOTE — Op Note (Signed)
 Pt. Name/Age/DOB: Debra Khan / 73 y.o. / 1951-01-10 Med. Record Number: [id number] Date of Admission: 09/07/23 Date of Operation/Procedure: 09/07/23 Preoperative Diagnosis: GERD Postoperative Diagnosis: GERD s/p combo transoral incisionless fundoplication Surgeon: Dr. Sammi Crick Co-Surgeon: Dr.Jenkins Anesthesia Provider(s): Cassie Click, MD Anesthesia Type: General OPERATION: LAPAROSCOPIC REPAIR OF DIAPHRAGMATIC HERNIA, ESOPHAGOGASTRIC FUNDOPLASTY with cosurgeon of different specialty (CPT 43281/82, modifier 62) Operative Indications: Debra Khan is a 73 y.o. year old female who presents with worsening symptoms of  gastroesophageal reflux disease. Further evaluation revealed a diaphragmatic hernia. The patient was found to be a  good candidate for repair of this diaphragmatic hernia followed by esophagogastric fundoplasty. The diaphragmatic  hernia repair was performed by Dr. Larrie Po immediately preceding the esophagogastric fundoplasty performed by  me. The risks, benefits, potential complications, and alternatives of procedure were discussed at length and in detail  with the patient. The patient demonstrated understanding. All of the patient's questions were answered appropriately  and informed consent was obtained.  Operative Description: Care of the patient was received from Dr. Larrie Po who performed the diaphragmatic hernia  repair immediately preceding the esophagogastric fundoplasty. Dr. Larrie Po performed the diaphragmatic hernia  repair without incident. Please see dictation from Dr. Larrie Po for details of that portion of the procedure. The patient remained under general anesthesia for the esophagogastric fundoplasty portion of the procedure. The  patient was placed in a left lateral position. PRE-OPERATIVE ENDOSCOPY A pre-operative endoscopy was performed prior to device insertion to examine the esophagus, the GEV and the  stomach while the patient was fully anesthetized  with complete diaphragmatic relaxation. Gastric insufflation was then  established using air and carbon dioxide insufflation to a steady state of pressure of 18 mmHg. The following was  reassessed and annotated:  The esophageal lumen for esophagitis, stricture, ulceration, or other pathology  Distance of the squamocolumnar junction (z-line) from the incisors  Distance of the diaphragm from the incisors  Characteristics of the GEV (Hill Grade)  Presence and characteristics of hiatal defects including transverse and axial dimensions  In addition, endoscopy is necessary to confirm that the patient was NPO-compliant prior to the procedure. As  needed stomach was cleaned to extract any residual food.  A 60-French bougie was placed in the proximal esophagus by physician, above the gastroesophageal junction. DEVICE INSERTION The EsophyX device in sterile package, handled accordingly. Lubrication was used judiciously on both the endoscope and  the EsophyX device. Specifically, lubrication was applied to the: Tissue mold, chassis and two-thirds of the device shaft  (to approximately the 36 cm marking), device lumen on both proximal and distal sides and stylet ports.  ENDOSCOPE AND ESOPHYX DEVICE ASSEMBLY After lubrication was applied, the endoscope was inserted through the endoscope seal of the EsophyX device and  advanced until the endoscope was aligned with the distal end of the tissue mold. Endoscope was advanced until the  endoscope handle was flush with the EsophyX handle. The endoscope was extended 10-15 cm beyond the tissue mold  and acted as a guide wire during insertion. Device controls were verified are prepared for insertion before introducing  the distal end of the endoscope into the oral cavity.  DEVICE ORIENTATION Proper orientation of the EsophyX device was critical during insertion. To navigate the oral cavity in the safest manner  possible, EsophyX device was rotated so that any one of  the following three visual cues were evident:  The tissue mold knob was aligned with the patient's left shoulder  The back of the tissue mold  was oriented towards patient's hard palate  The fastener cartridge was aligned with the patient's nares Assembly is advanced under direct visualization with the endoscopic image centered in the esophageal lumen. ENTRY TO THE STOMACH AND GEJ As the scope passed the GEJ and entered the stomach, scope is retroflexed to observe the GEJ as EsophyX device was  inserted. When the tissue mold passed the GEJ, the EsophyX device is oriented so that the back of the tissue mold faced  the lesser curve. This allowed the tissue mold to close towards the greater curve. The scope is taken out of retroflex and withdrawn into the device. Scope was retracted just above the proximal blue link,  closed the tissue mold slightly and slide the scope behind the tissue mold to advance the endoscope into the stomach  and return to retroflex. Under direct visualization, device was advanced further into the stomach while simultaneously  closing the tissue mold. Care was used to ensure that the end of the tissue mold does not drag along the gastric mucosa and damage tissue. The device is advanced far enough into the stomach so that the tissue mold could be completely closed against the chassis of  the device without capturing any tissue (plastic-to-plastic). VALVE RECONSTRUCTION AND FUNDOPLASTY A total of 22 fasteners were deployed. In both the 11 and 1 o'clock positions, six fasteners were placed in each position,  approximately 1-2cm cm above the z-line. In the 5 o'clock positions, six fasteners were placed 3-4cm above the  z-line. In the 7 o'clock positions, four fasteners were placed 3-4cm above the  z-line. Using the components of the EsophyX device, the TIF 2.0 procedure allows:  Elongation of the intraabdominal esophagus  Construction a 270-degree, 3 cm length omega-shaped flap  valve/partial fundoplasty  Recreating dynamics of the angle of His  Restoring a distal high-pressure zone 11 O'CLOCK AND 1 O'CLOCK POSITIONS The goal of the 11 o'clock and 1 o'clock positions to restore and bolster the posterior and anterior aspects of the valve  along the lesser curvature. These positions entailed significant radial/rotational reconstruction and moderate elongation  of the tissue.  With the tissue mold at the 11 o'clock position, the scope was oriented to provide a clear view of the helical retractor.  The retractor was engaged at the z-line and subsequently disengaged from the tissue mold channel. The EsophyX device was adjusted so that the proximal blue link of the chassis was placed in the esophagus. The stomach was then deflated  to enable retraction of tissue into the device and simultaneous rotation of tissue around the distal esophagus posterior  toward the lesser curve. Every time, once both fasteners were delivered, both channels were reloaded, and the invaginator was deactivated. After having deployed six fasteners in the 11 o'clock position, the retractor was removed from tissue and placed in the  home position. The tissue mold was closed plastic-to-plastic and rotated through the greater curve of the stomach to  the 1 o'clock position. With the tissue mold at the 1 o'clock position, the scope was oriented to provide a clear view of the helical retractor.  The retractor was engaged at the z-line and subsequently disengaged from the tissue mold channel. The EsophyX device  was adjusted so that the proximal blue link of the chassis was placed in the esophagus. The stomach was then deflated  to enable retraction of tissue into the device and simultaneous rotation of tissue around the distal esophagus anterior  toward the lesser curve. 5 O'CLOCK AND  7 O'CLOCK POSITIONS The goal of the 5 and 7 o'clock positions is to elongate the GEV along the greater curvature and  moderately wrap tissue  located along the greater curvature toward the lesser curvature. Four fasteners were placed at each position  approximately 3-4 cm above the z-line. Whereas the 1 o'clock and 11 o'clock positions are intended to bolster the  anterior and posterior aspects of the GEV, the 5 o'clock and 7 o'clock positions were intended to plicate the fundus to  the intraabdominal esophagus, obtain maximum length of tissue and further recreate the dynamics of the angle of His. With the tissue mold completely closed at the 5 o'clock position, endoscope was oriented to the helical view. The helical  retractor was inserted at the z-line and subsequently disengaged from the tissue mold channel. The device was set again  so that the proximal blue link of the chassis was in the esophagus. The stomach was then deflated to enable retraction  of tissue into the device and anterior rotation of tissue around the distal esophagus toward the lesser curve. After having deployed four fasteners at the 5 o'clock position the retractor was removed from tissue and placed in the  home position. The device was rotated to the 7 o'clock position and the sequence above was repeated with the  difference that the tissue was moderately wrapped posteriorly from 7 o'clock toward the lesser curvature. After  deploying the last four fasteners at the 7 o'clock position the retractor was removed from tissue and placed in the home  position. A total of 22 fasteners were deployed.  There were 2 other fasteners that did not engage with the mucosa, so they were discarded.   DEVICE REMOVAL Once the partial fundoplasty was successfully created and the valve has been rebuilt, the following was confirmed prior  to device removal:  Helical Retractor - unlocked and in-home position  Stylet - pulled completely inside the device.  Tissue Mold - back of the mold is facing the lesser curve  Invaginator - stopcock was in the off  position and the tubing disconnected To begin safe device removal, scope was manipulated out of retroflex and pulled back into the device until visualized at  the proximal blue link. Under full visualization, tissue mold was opened as device was pulled out proximally. Helical  retractor was confirmed to be in home position.  Device was removed slowly, carefully and completely with the endoscope sitting just proximal to the helical retractor,  inside the tissue mold with the retractor always in full view. POST-OPERATIVE ENDOSCOPY The post-procedure endoscopy was performed to examine the esophagus, GEJ and the stomach to:  Evaluate, measure and grade valve shape, length and circumference  Check for hemostasis  Remove any remaining insufflation from intestines and stomach  Inspect the esophagus  Note fastener locations Having tolerated the procedure well, the patient was subsequently taken to the recovery room in good and stable  condition.  Wound Closure: Adequate Evidence of infection: No Estimated Blood Loss:  Minimal Blood Transfusion: No Drains: No Specimen (s): None Implants: 22 fasteners Complications: None Immediate Post-Operative Condition: Good  Co-surgeon requirement: Dr. Larrie Po was required as a physician with a different specialty and skills to perform this  specific procedure with a single procedure code.  Electronically signed by: Samantha Cress, MD Gastroenterology and Hepatology [facility] Gastroenterology

## 2023-09-07 NOTE — Plan of Care (Signed)

## 2023-09-07 NOTE — Op Note (Signed)
 [facility] Patient Name: Debra Khan Procedure Date: 09/07/2023 11:31 AM MRN: [id number] Date of Birth: Feb 04, 1951 Attending MD: Samantha Cress , , [id number] CSN: [id number] Age: 73 Admit Type: Outpatient Procedure:                Upper GI endoscopy Indications:              For therapy of reflux esophagitis, For therapy of                            gastro-esophageal reflux disease - combo transoral                            incisionless fundoplication. Providers:                Samantha Cress, Tammy Vaught, RN, Crystal Page,                            Theola Fitch, Annell Barrow Referring MD:              Medicines:                General Anesthesia Complications:            No immediate complications. Estimated Blood Loss:     Estimated blood loss: none. Procedure:                Pre-Anesthesia Assessment:                           - Prior to the procedure, a History and Physical                            was performed, and patient medications, allergies                            and sensitivities were reviewed. [name]                            tolerance of previous anesthesia was reviewed.                           - The risks and benefits of the procedure and the                            sedation options and risks were discussed with the                            patient. All questions were answered and informed                            consent was obtained.                           - After reviewing the risks and benefits, the                            patient was deemed in  satisfactory condition to                            undergo the procedure.                           - ASA Grade Assessment: I - A normal, healthy                            patient.                           After obtaining informed consent, the endoscope was                            passed under direct vision. Throughout the                            procedure, the  patient's blood pressure, pulse, and                            oxygen saturations were monitored continuously. The                            GIF-H190 ([id number]) scope was introduced through the                            mouth, and advanced to the second part of duodenum.                            The upper GI endoscopy was accomplished without                            difficulty. The patient tolerated the procedure                            well. Scope In: 1:59:46 PM Scope Out: 3:02:49 PM Total Procedure Duration: 1 hour 3 minutes 3 seconds  Findings:      The Z-line was regular and was found 39 cm from the incisors. A       guidewire was placed and the scope was withdrawn. Dilation was performed       with a Savary dilator with mild resistance at 20 mm in preparation for       TIF.      LA Grade B (one or more mucosal breaks greater than 5 mm, not extending       between the tops of two mucosal folds) esophagitis with no bleeding was       found in the lower third of the esophagus.      The gastroesophageal junction was normal. The decision was made to       perform transoral fundoplication with the EsophyX Z system. Before the       procedure, the gastroesophageal flap valve was classified as Hill Grade       I (prominent fold, tight to endoscope) after hernia repair was performed       by Dr. Larrie Po. The  endoscope was withdrawn, placed through the       plication device, reinserted into the patient and advanced past the       level of the GE junction at 39 cm from the incisors and into the       stomach. Next, the endoscope was advanced beyond the device and       retroflexed. The first plication site was identified at the 11 o'clock       position. With the device in the proper position, the helical retractor       was deployed and tissue was pulled into the mold before it was closed.       The device was rotated, suction was applied using the invaginator, then       the  device was advanced slightly and two H-shaped fasteners were placed.       The device was reloaded and the process repeated in order to deploy a       total of six fasteners at the first site. The device was then rotated to       the 1 o'clock position after which the helical retractor was used to       grasp additional tissue within the mold before rotation and deployment       of a total of six fasteners at the second site. To complete       reconstruction of the valve, additional fasteners were deployed at the       following sites: six fasteners at 5 o'clock and four fasteners at 7       o'clock positions. In total, 22 fasteners contributed to create a valve       measuring 3 cm in length which involved 270 degrees of the circumference       upon retroflexed view. Following the procedure, the flap valve was       reclassified as Hill Grade I (prominent fold, tight to endoscope). The       EsophyX device and endoscope were then removed. Relook endoscopy was       performed prior to the conclusion of the case to confirm the above       findings.      The entire examined stomach was normal.      The examined duodenum was normal. Impression:               - Z-line regular, 39 cm from the incisors. Dilated.                           - LA Grade B reflux esophagitis with no bleeding.                           - Normal gastroesophageal junction.                           - Normal stomach.                           - Normal examined duodenum.                           - EsophyX transoral fundoplication was performed.                           -  No specimens collected. Moderate Sedation:      Per Anesthesia Care Recommendation:           - Admit the patient to hospital ward for                            observation.                           - Clear liquid diet for 3 days. Will advance diet                            afterwards as directed: 2 weeks of LIQUID diet, 2                             weeks of puree consistency diet, 2 weeks of soft                            diet, on the 6th week can restart regular diet                           - Take Augmentin  twice a day for 5 days                           - Continue omeprazole  40 mg every 12 hours for 4                            weeks. Will give IV pantoprazole  while hospitalized.                           - Start Zofran  8 mg every 6 hours as needed for                            nausea.                           - Phenergan 25 mg every 8 hours as needed for                            refractory nausea                           - Start acetaminophen  1 g every 8 hours as needed                            for pain                           - Cepacol lozenges for sore throat as needed                           - Can take Gas-X as needed ever 6 hours if  presenting abdominal bloating or burping                           - Do not lift any weight >5 lb for 2 weeks, not                            lifting weight heavier than 10 lb between week 2-4,                            and not lifting weight heavier than 15 lb between                            week 4-6                           - Return to GI clinic in 1 week for follow up Procedure Code(s):        --- Professional ---                           [phone], 62, Laparoscopy, surgical, repair of                            paraesophageal hernia, includes fundoplasty, when                            performed; without implantation of mesh Diagnosis Code(s):        --- Professional ---                           K21.00, Gastro-esophageal reflux disease with                            esophagitis, without bleeding CPT copyright 2022 [employer]. All rights reserved. The codes documented in this report are preliminary and upon coder review may  be revised to meet current compliance requirements. Samantha Cress, MD Samantha Cress,  09/07/2023  4:39:13 PM This report has been signed electronically. Number of Addenda: 0

## 2023-09-07 NOTE — Progress Notes (Signed)
 Patient transferred to 333 via stretcher.  Patient was able to ambulate to bathroom and void.

## 2023-09-07 NOTE — Transfer of Care (Signed)
 Immediate Anesthesia Transfer of Care Note  Patient: Debra Khan  Procedure(s) Performed: REPAIR, HERNIA, PARAESOPHAGEAL, ROBOT-ASSISTED (Abdomen) FUNDOPLICATION, STOMACH, INCISIONLESS, ORAL APPROACH  Patient Location: PACU  Anesthesia Type:General  Level of Consciousness: awake, drowsy, and patient cooperative  Airway & Oxygen Therapy: Patient Spontanous Breathing and Patient connected to nasal cannula oxygen  Post-op Assessment: Report given to RN, Post -op Vital signs reviewed and stable, and Patient moving all extremities X 4  Post vital signs: Reviewed and stable  Last Vitals:  Vitals Value Taken Time  BP 97/55 09/07/23 15:16  Temp 36.3 C 09/07/23 15:15  Pulse 72 09/07/23 15:17  Resp 27 09/07/23 15:17  SpO2 97 % 09/07/23 15:17  Vitals shown include unfiled device data.  Last Pain:  Vitals:   09/07/23 0938  TempSrc: Oral  PainSc: 0-No pain         Complications: No notable events documented.

## 2023-09-08 ENCOUNTER — Encounter: Payer: Medicare Other | Admitting: Family Medicine

## 2023-09-08 ENCOUNTER — Encounter (HOSPITAL_COMMUNITY): Payer: Self-pay | Admitting: General Surgery

## 2023-09-08 DIAGNOSIS — K449 Diaphragmatic hernia without obstruction or gangrene: Secondary | ICD-10-CM | POA: Diagnosis not present

## 2023-09-08 DIAGNOSIS — K219 Gastro-esophageal reflux disease without esophagitis: Secondary | ICD-10-CM | POA: Diagnosis not present

## 2023-09-08 LAB — CBC
HCT: 37.5 % (ref 36.0–46.0)
Hemoglobin: 13.1 g/dL (ref 12.0–15.0)
MCH: 33.5 pg (ref 26.0–34.0)
MCHC: 34.9 g/dL (ref 30.0–36.0)
MCV: 95.9 fL (ref 80.0–100.0)
Platelets: 243 10*3/uL (ref 150–400)
RBC: 3.91 MIL/uL (ref 3.87–5.11)
RDW: 13.5 % (ref 11.5–15.5)
WBC: 12.2 10*3/uL — ABNORMAL HIGH (ref 4.0–10.5)
nRBC: 0 % (ref 0.0–0.2)

## 2023-09-08 LAB — BASIC METABOLIC PANEL WITH GFR
Anion gap: 8 (ref 5–15)
BUN: 15 mg/dL (ref 8–23)
CO2: 24 mmol/L (ref 22–32)
Calcium: 8.7 mg/dL — ABNORMAL LOW (ref 8.9–10.3)
Chloride: 106 mmol/L (ref 98–111)
Creatinine, Ser: 0.87 mg/dL (ref 0.44–1.00)
GFR, Estimated: >60 mL/min (ref >60)
Glucose, Bld: 205 mg/dL — ABNORMAL HIGH (ref 70–99)
Potassium: 4.2 mmol/L (ref 3.5–5.1)
Sodium: 138 mmol/L (ref 135–145)

## 2023-09-08 MED ORDER — PROMETHAZINE HCL 25 MG PO TABS: 25.0000 mg | ORAL_TABLET | Freq: Three times a day (TID) | ORAL | 0 refills | Status: DC | PRN

## 2023-09-08 MED ORDER — MAGIC MOUTHWASH W/LIDOCAINE: 10.0000 mL | Freq: Four times a day (QID) | ORAL | 0 refills | Status: DC | PRN

## 2023-09-08 MED ORDER — AMOXICILLIN-POT CLAVULANATE 875-125 MG PO TABS: 1.0000 | ORAL_TABLET | Freq: Two times a day (BID) | ORAL | 0 refills | Status: AC

## 2023-09-08 MED ORDER — SIMETHICONE 80 MG PO TABS: 1.0000 | ORAL_TABLET | Freq: Three times a day (TID) | ORAL | 0 refills | Status: DC | PRN

## 2023-09-08 MED ORDER — LIDOCAINE VISCOUS HCL 2 % MT SOLN: 5.0000 mL | Freq: Four times a day (QID) | OROMUCOSAL | 1 refills | Status: DC | PRN

## 2023-09-08 MED ORDER — ONDANSETRON 4 MG PO TBDP: 4.0000 mg | ORAL_TABLET | Freq: Three times a day (TID) | ORAL | 0 refills | Status: DC | PRN

## 2023-09-08 MED ORDER — OMEPRAZOLE 40 MG PO CPDR: 40.0000 mg | DELAYED_RELEASE_CAPSULE | Freq: Two times a day (BID) | ORAL | 0 refills | Status: DC

## 2023-09-08 NOTE — Progress Notes (Signed)
 1 Day Post-Op  Subjective: Patient denies any incisional pain of significance.  She does complain of a sore throat.  Objective: Vital signs in last 24 hours: Temp:  [97.4 F (36.3 C)-98.8 F (37.1 C)] 98.8 F (37.1 C) (06/19 0343) Pulse Rate:  [62-75] 75 (06/19 0343) Resp:  [14-25] 14 (06/19 0343) BP: (97-136)/(55-68) 113/59 (06/19 0343) SpO2:  [94 %-97 %] 96 % (06/19 0343) Weight:  [64.4 kg-64.5 kg] 64.4 kg (06/18 1708) Last BM Date : 09/06/23  Intake/Output from previous day: 06/18 0701 - 06/19 0700 In: 1100 [I.V.:1000; IV Piggyback:100] Out: 20 [Blood:20] Intake/Output this shift: No intake/output data recorded.  General appearance: alert, cooperative, and no distress GI: soft, non-tender; bowel sounds normal; no masses,  no organomegaly and incisions healing well.  Lab Results:  Recent Labs    09/07/23 1535 09/08/23 0444  WBC 10.9* 12.2*  HGB 15.8* 13.1  HCT 45.3 37.5  PLT 233 243   BMET Recent Labs    09/07/23 1535 09/08/23 0444  NA 139 138  K 3.8 4.2  CL 103 106  CO2 24 24  GLUCOSE 178* 205*  BUN 11 15  CREATININE 0.73 0.87  CALCIUM  9.4 8.7*   PT/INR No results for input(s): LABPROT, INR in the last 72 hours.  Studies/Results: No results found.  Anti-infectives: Anti-infectives (From admission, onward)    Start     Dose/Rate Route Frequency Ordered Stop   09/07/23 2200  amoxicillin -clavulanate (AUGMENTIN ) 875-125 MG per tablet 1 tablet        1 tablet Oral Every 12 hours 09/07/23 1528 09/12/23 2159   09/07/23 0930  ceFAZolin  (ANCEF ) IVPB 2g/100 mL premix        2 g 200 mL/hr over 30 Minutes Intravenous On call to O.R. 09/07/23 0924 09/07/23 1254       Assessment/Plan: s/p Procedure(s): REPAIR, HERNIA, PARAESOPHAGEAL, ROBOT-ASSISTED FUNDOPLICATION, STOMACH, INCISIONLESS, ORAL APPROACH Impression: Postoperative day 1.  Patient looks good.  She is stable for discharge from surgical standpoint.  I will call her in 1 week for follow-up.   LOS: 0 days    Alanda Allegra 09/08/2023

## 2023-09-08 NOTE — TOC CM/SW Note (Signed)
 Transition of Care [facility]) - Inpatient Brief Assessment   Patient Details  Name: Debra Khan MRN: [id number] Date of Birth: 1950-12-10  Transition of Care [facility]) CM/SW Contact:    Cyndie Dredge, LCSWA Phone Number: 09/08/2023, 9:38 AM   Clinical Narrative:  Transition of Care Department [facility]) has reviewed patient and no TOC needs have been identified at this time. We will continue to monitor patient advancement through interdisciplinary progression rounds. If new patient transition needs arise, please place a TOC consult.   Transition of Care Asessment: Insurance and Status: Insurance coverage has been reviewed Patient has primary care physician: Yes Home environment has been reviewed: Single Family Home Prior level of function:: Independent Prior/Current Home Services: No current home services Social Drivers of Health Review: SDOH reviewed no interventions necessary Readmission risk has been reviewed: Yes Transition of care needs: no transition of care needs at this time

## 2023-09-08 NOTE — Discharge Summary (Signed)
 Physician Discharge Summary  Patient ID: Debra Khan MRN: [id number] DOB/AGE: [birth year] 72 y.o.  Admit date: 09/07/2023 Discharge date: 09/08/2023  Admission Diagnoses:  Discharge Diagnoses:  Principal Problem:   Status post transoral endoluminal fundoplication   Discharged Condition: good  Hospital Course: Patient underwent successful combo TIF on 09/07/2023.  Had adequate postprocedure recovery.  She was kept in observation for less than 24 hours.   Patient has presented an adequate postprocedural progress as patient has been able to tolerate diet without presence of any significant abdominal pain or nausea/vomiting.  Patient has followed the dietary recommendations and is committed to comply with the recommendations to achieve postprocedural success.  Should continue with the dietary recommendations, PPI twice daily, antiemetics, magic mouthwash and lifting restrictions as discussed in the past.  Consults: None  Significant Diagnostic Studies: None  Treatments: IV hydration, antibiotics: Augmentin , analgesia: acetaminophen , and magic mouthwash  Discharge Exam: Blood pressure (!) 113/59, pulse 75, temperature 98.8 F (37.1 C), temperature source Oral, resp. rate 14, height 5' 4 (1.626 m), weight 64.4 kg, SpO2 96%. GENERAL: The patient is AO x3, in no acute distress. HEENT: Head is normocephalic and atraumatic. EOMI are intact. Mouth is well hydrated and without lesions. NECK: Supple. No masses LUNGS: Clear to auscultation. No presence of rhonchi/wheezing/rales. Adequate chest expansion HEART: RRR, normal s1 and s2. ABDOMEN: Soft, nontender, no guarding, no peritoneal signs, and nondistended. BS +. No masses.  Surgical wounds in abdomen. EXTREMITIES: Without any cyanosis, clubbing, rash, lesions or edema. NEUROLOGIC: AOx3, no focal motor deficit. SKIN: no jaundice, no rashes  Disposition: Discharge disposition: 01-Home or Self Care       Discharge Instructions      Discharge instructions   Complete by: As directed    - Remember to follow the dietary recommendations: 2 weeks of LIQUID diet, 2 weeks of puree consistency diet, 2 weeks of soft diet, on the 6th week can restart regular diet - Take Augmentin  twice a day for 4 days - Continue omeprazole  capsules 40 mg every 12 hours for 4 weeks - Can take magic mouthwash as need for sore throat - Take Zofran  4 mg every 6 hours as needed nausea control. Can take phenergan as needed. - acetaminophen  1 g every 6 hours for abdominal pain - Can take Gas-X/simethicone as needed ever 6 hours if presenting abdominal bloating - Do not lift any weight >5 lb for 4 weeks, then do not lift any weight heavier than 15 lb between week 4-8 - Return to GI clinic in 1 week for follow up - 6/26 at 3:40 PM   Increase activity slowly   Complete by: As directed       Allergies as of 09/08/2023   No Known Allergies      Medication List     STOP taking these medications    doxycycline 50 MG capsule Commonly known as: VIBRAMYCIN       TAKE these medications    acetaminophen  500 MG tablet Commonly known as: TYLENOL  Take 1,000 mg by mouth every 6 (six) hours as needed for moderate pain (pain score 4-6).   amoxicillin -clavulanate 875-125 MG tablet Commonly known as: AUGMENTIN  Take 1 tablet by mouth every 12 (twelve) hours for 4 days.   CALCIUM  MAGNESIUM PO Take 1 tablet by mouth daily.   D3 2000 PO Take 2,000 Units by mouth daily.   ENZYME DIGEST PO Take 1 capsule by mouth 2 (two) times daily with a meal. Digest Assure   HAIR SKIN &  NAILS PO Take 1 capsule by mouth daily.   Eye Support Tabs Take 1 tablet by mouth in the morning.   Lubricant Eye Drop 0.6 % Soln Generic drug: Propylene Glycol Place 1-2 drops into both eyes 3 (three) times daily as needed (dry/irritated eyes).   magic mouthwash (lidocaine , diphenhydrAMINE , alum & mag hydroxide) suspension Swish and swallow 5 mLs 4 (four) times daily as  needed (sore throat).   omeprazole  40 MG capsule Commonly known as: PRILOSEC Take 1 capsule (40 mg total) by mouth 2 (two) times daily. What changed: when to take this   ondansetron  4 MG disintegrating tablet Commonly known as: ZOFRAN -ODT Take 1 tablet (4 mg total) by mouth every 8 (eight) hours as needed for nausea or vomiting.   promethazine 25 MG tablet Commonly known as: PHENERGAN Take 1 tablet (25 mg total) by mouth every 8 (eight) hours as needed for nausea or vomiting (nausea not improving with zofran ).   Simethicone 80 MG Tabs Take 1 tablet (80 mg total) by mouth every 8 (eight) hours as needed (burping or bloating).        Follow-up Information     Alanda Allegra, MD Follow up.   Specialty: General Surgery Why: As needed.  Will call you next week for follow up. Contact information: [address] [place] [address] [phone]                 Signed: Samantha Cress Mayorga 09/08/2023, 12:17 PM

## 2023-09-08 NOTE — Progress Notes (Signed)
 Mobility Specialist Progress Note:    09/08/23 1018  Mobility  Activity Refused mobility   Pt politely refused mobility, has been ambulating in the room and is eager for discharge. All needs met.   Glinda Lapping Mobility Specialist Please contact via Special educational needs teacher or  Rehab office at [phone]

## 2023-09-09 ENCOUNTER — Telehealth: Payer: Self-pay

## 2023-09-09 NOTE — Transitions of Care (Post Inpatient/ED Visit) (Signed)
 09/09/2023  Name: Debra Khan MRN: [id number] DOB: [birth year]  Today's TOC FU Call Status: Today's TOC FU Call Status:: Successful TOC FU Call Completed TOC FU Call Complete Date: 09/09/23 Patient's Name and Date of Birth confirmed.  Transition Care Management Follow-up Telephone Call Date of Discharge: 09/08/23 Discharge [facility] Type of Discharge: Inpatient Admission Primary Inpatient Discharge Diagnosis:: Hiatal Hernia How have you been since you were released from the hospital?: Same Any questions or concerns?: No  Items Reviewed: Did you receive and understand the discharge instructions provided?: Yes Medications obtained,verified, and reconciled?: Yes (Medications Reviewed) Any new allergies since your discharge?: No Dietary orders reviewed?: Yes Type of Diet Ordered:: Liquid Diet Do you have support at home?: Yes People in Home [RPT]: child(ren), adult  Medications Reviewed Today: Medications Reviewed Today     Reviewed by Willie Harry, RN (Registered Nurse) on 09/09/23 at [phone]  Med List Status: <None>   Medication Order Taking? Sig Documenting Provider Last Dose Status Informant  acetaminophen  (TYLENOL ) 500 MG tablet [id number] Yes Take 1,000 mg by mouth every 6 (six) hours as needed for moderate pain (pain score 4-6). [provider]  Active Self  amoxicillin -clavulanate (AUGMENTIN ) 875-125 MG tablet [id number] Yes Take 1 tablet by mouth every 12 (twelve) hours for 4 days. Castaneda Mayorga, Bearl Limes, MD  Active   Calcium -Magnesium-Vitamin D  (CALCIUM  MAGNESIUM PO) [id number] Yes Take 1 tablet by mouth daily. [provider]  Active Self  Cholecalciferol (D3 2000 PO) [id number] Yes Take 2,000 Units by mouth daily. [provider]  Active Self  Digestive Enzymes (ENZYME DIGEST PO) [id number] Yes Take 1 capsule by mouth 2 (two) times daily with a meal. Digest Assure [provider]  Active Self    Discontinued 09/09/23  0920 (Completed Course)   Multiple Vitamins-Minerals (EYE SUPPORT) TABS [id number] Yes Take 1 tablet by mouth in the morning. [provider]  Active Self  Multiple Vitamins-Minerals (HAIR SKIN & NAILS PO) [id number] Yes Take 1 capsule by mouth daily. [provider]  Active Self  omeprazole  (PRILOSEC) 40 MG capsule [id number] Yes Take 1 capsule (40 mg total) by mouth 2 (two) times daily. Castaneda Mayorga, Bearl Limes, MD  Active   ondansetron  (ZOFRAN -ODT) 4 MG disintegrating tablet [id number] Yes Take 1 tablet (4 mg total) by mouth every 8 (eight) hours as needed for nausea or vomiting. Castaneda Mayorga, Bearl Limes, MD  Active   promethazine (PHENERGAN) 25 MG tablet [id number] Yes Take 1 tablet (25 mg total) by mouth every 8 (eight) hours as needed for nausea or vomiting (nausea not improving with zofran ). Castaneda Mayorga, Bearl Limes, MD  Active   Propylene Glycol (LUBRICANT EYE DROP) 0.6 % SOLN [id number] Yes Place 1-2 drops into both eyes 3 (three) times daily as needed (dry/irritated eyes). [provider]  Active Self  Simethicone 80 MG TABS [id number] Yes Take 1 tablet (80 mg total) by mouth every 8 (eight) hours as needed (burping or bloating). Urban Garden, MD  Active             Home Care and Equipment/Supplies: Were Home Health Services Ordered?: NA Any new equipment or medical supplies ordered?: No  Functional Questionnaire: Do you need assistance with bathing/showering or dressing?: No Do you need assistance with meal preparation?: No Do you need assistance with eating?: No Do you have difficulty maintaining continence: No Do you need assistance with getting out of bed/getting out of a chair/moving?: No Do you have difficulty managing or taking your medications?:  No  Follow up appointments reviewed: PCP Follow-up appointment confirmed?: No (Patient declines scheduling appointment today) Specialist Hospital Follow-up appointment confirmed?: Yes Date of  Specialist follow-up appointment?: 09/15/23 Follow-Up Specialty Provider:: Dr. Umberto Ganong Do you need transportation to your follow-up appointment?: No Do you understand care options if your condition(s) worsen?: Yes-patient verbalized understanding    SIGNATURE: Horatio Lynch, BSN, RN

## 2023-09-10 NOTE — Anesthesia Postprocedure Evaluation (Signed)
 Anesthesia Post Note  Patient: Debra Khan  Procedure(s) Performed: REPAIR, HERNIA, PARAESOPHAGEAL, ROBOT-ASSISTED (Abdomen) FUNDOPLICATION, STOMACH, INCISIONLESS, ORAL APPROACH  Patient location during evaluation: Phase II Anesthesia Type: General Level of consciousness: awake Pain management: pain level controlled Vital Signs Assessment: post-procedure vital signs reviewed and stable Respiratory status: spontaneous breathing and respiratory function stable Cardiovascular status: blood pressure returned to baseline and stable Postop Assessment: no headache and no apparent nausea or vomiting Anesthetic complications: no Comments: Late entry   No notable events documented.   Last Vitals:  Vitals:   09/08/23 0036 09/08/23 0343  BP: 120/60 (!) 113/59  Pulse: 73 75  Resp: 14 14  Temp: 36.6 C 37.1 C  SpO2: 94% 96%    Last Pain:  Vitals:   09/08/23 1450  TempSrc:   PainSc: 1                  Yvonna [name]

## 2023-09-15 ENCOUNTER — Encounter (INDEPENDENT_AMBULATORY_CARE_PROVIDER_SITE_OTHER): Payer: Self-pay | Admitting: Gastroenterology

## 2023-09-15 ENCOUNTER — Ambulatory Visit (INDEPENDENT_AMBULATORY_CARE_PROVIDER_SITE_OTHER): Admitting: Gastroenterology

## 2023-09-15 VITALS — BP 117/76 | HR 76 | Temp 97.8°F | Ht 64.0 in | Wt 136.8 lb

## 2023-09-15 DIAGNOSIS — K219 Gastro-esophageal reflux disease without esophagitis: Secondary | ICD-10-CM

## 2023-09-15 DIAGNOSIS — Z9889 Other specified postprocedural states: Secondary | ICD-10-CM

## 2023-09-15 NOTE — Patient Instructions (Signed)
 Continue omeprazole  40 mg twice a day for total of 4 weeks, then stop this medication If nauseated, please take Zofran  If presenting bloating, can take Gas-X as needed Follow the dietary recommendations: 2 weeks of LIQUID diet, 2 weeks of puree consistency diet, 2 weeks of soft diet, on the 6th week can restart regular diet Do not lift any weight >5 lb for 4 weeks, then do not lift any weight heavier than 15 lb between week 4-8
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
## 2023-09-16 ENCOUNTER — Ambulatory Visit (INDEPENDENT_AMBULATORY_CARE_PROVIDER_SITE_OTHER): Admitting: General Surgery

## 2023-09-16 DIAGNOSIS — Z09 Encounter for follow-up examination after completed treatment for conditions other than malignant neoplasm: Secondary | ICD-10-CM

## 2023-09-16 NOTE — Progress Notes (Unsigned)
 Subjective:     Debra Khan  Virtual postoperative telephone visit performed with patient.  Patient was at home and I was in the hospital.  She states she is doing well.  She has no incisional pain.  She has seen Dr. Eartha of GI yesterday and was told things are going well.  Her reflux has resolved at the present time. Objective:    There were no vitals taken for this visit.  General:  alert and cooperative       Assessment:    Doing well postoperatively.    Plan:   I told her she could resume normal activity as able.  Follow-up with me as needed.  As this was a part of the total global surgical fee, this was not a billable visit.  Total telephone time was 1-1/2 minutes.

## 2023-09-19 ENCOUNTER — Encounter: Payer: Self-pay | Admitting: General Surgery

## 2023-10-10 ENCOUNTER — Ambulatory Visit (INDEPENDENT_AMBULATORY_CARE_PROVIDER_SITE_OTHER): Admitting: Gastroenterology

## 2023-10-10 ENCOUNTER — Encounter (INDEPENDENT_AMBULATORY_CARE_PROVIDER_SITE_OTHER): Payer: Self-pay | Admitting: Gastroenterology

## 2023-10-10 VITALS — BP 127/71 | HR 70 | Temp 98.4°F | Ht 64.0 in | Wt 135.7 lb

## 2023-10-10 DIAGNOSIS — Z9889 Other specified postprocedural states: Secondary | ICD-10-CM

## 2023-10-10 DIAGNOSIS — K219 Gastro-esophageal reflux disease without esophagitis: Secondary | ICD-10-CM
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
## 2023-10-10 NOTE — Patient Instructions (Signed)
 Continue soft diet until you complete 6 weeks, then can advance diet to regular food Do not lift any objects heavier than 15 pounds until you reach 6 weeks after your procedure, after this you can return to your normal lifting activities

## 2023-10-26 DIAGNOSIS — Z1231 Encounter for screening mammogram for malignant neoplasm of breast: Secondary | ICD-10-CM | POA: Diagnosis not present

## 2023-10-26 LAB — HM MAMMOGRAPHY

## 2023-10-27 ENCOUNTER — Ambulatory Visit: Payer: Self-pay | Admitting: Family Medicine

## 2023-10-27 ENCOUNTER — Encounter: Payer: Self-pay | Admitting: Family Medicine

## 2023-12-15 ENCOUNTER — Ambulatory Visit: Admitting: Family Medicine

## 2023-12-15 ENCOUNTER — Encounter: Payer: Self-pay | Admitting: Family Medicine

## 2023-12-15 VITALS — BP 128/76 | HR 76 | Temp 98.1°F | Ht 64.0 in | Wt 136.0 lb

## 2023-12-15 DIAGNOSIS — R7309 Other abnormal glucose: Secondary | ICD-10-CM | POA: Diagnosis not present

## 2023-12-15 DIAGNOSIS — R61 Generalized hyperhidrosis: Secondary | ICD-10-CM

## 2023-12-15 DIAGNOSIS — D72829 Elevated white blood cell count, unspecified: Secondary | ICD-10-CM

## 2023-12-15 LAB — CBC WITH DIFFERENTIAL/PLATELET
Basophils Absolute: 0 K/uL (ref 0.0–0.1)
Basophils Relative: 0.3 % (ref 0.0–3.0)
Eosinophils Absolute: 0.1 K/uL (ref 0.0–0.7)
Eosinophils Relative: 0.9 % (ref 0.0–5.0)
HCT: 43.2 % (ref 36.0–46.0)
Hemoglobin: 14.5 g/dL (ref 12.0–15.0)
Lymphocytes Relative: 33.4 % (ref 12.0–46.0)
Lymphs Abs: 2.6 K/uL (ref 0.7–4.0)
MCHC: 33.6 g/dL (ref 30.0–36.0)
MCV: 95.8 fl (ref 78.0–100.0)
Monocytes Absolute: 0.7 K/uL (ref 0.1–1.0)
Monocytes Relative: 9.4 % (ref 3.0–12.0)
Neutro Abs: 4.4 K/uL (ref 1.4–7.7)
Neutrophils Relative %: 56 % (ref 43.0–77.0)
Platelets: 268 K/uL (ref 150.0–400.0)
RBC: 4.51 Mil/uL (ref 3.87–5.11)
RDW: 13.2 % (ref 11.5–15.5)
WBC: 7.8 K/uL (ref 4.0–10.5)

## 2023-12-15 LAB — COMPREHENSIVE METABOLIC PANEL WITH GFR
ALT: 42 U/L — ABNORMAL HIGH (ref 0–35)
AST: 40 U/L — ABNORMAL HIGH (ref 0–37)
Albumin: 4.8 g/dL (ref 3.5–5.2)
Alkaline Phosphatase: 72 U/L (ref 39–117)
BUN: 18 mg/dL (ref 6–23)
CO2: 30 meq/L (ref 19–32)
Calcium: 10.6 mg/dL — ABNORMAL HIGH (ref 8.4–10.5)
Chloride: 101 meq/L (ref 96–112)
Creatinine, Ser: 0.85 mg/dL (ref 0.40–1.20)
GFR: 68.39 mL/min (ref >60.00)
Glucose, Bld: 159 mg/dL — ABNORMAL HIGH (ref 70–99)
Potassium: 4.5 meq/L (ref 3.5–5.1)
Sodium: 139 meq/L (ref 135–145)
Total Bilirubin: 0.6 mg/dL (ref 0.2–1.2)
Total Protein: 7.7 g/dL (ref 6.0–8.3)

## 2023-12-15 LAB — TSH: TSH: 2.59 u[IU]/mL (ref 0.35–5.50)

## 2023-12-15 LAB — HEMOGLOBIN A1C: Hgb A1c MFr Bld: 6.8 % — ABNORMAL HIGH (ref 4.6–6.5)

## 2023-12-15 MED ORDER — VENLAFAXINE HCL ER 37.5 MG PO CP24: 37.5000 mg | ORAL_CAPSULE | Freq: Every day | ORAL | 1 refills | Status: DC

## 2023-12-15 NOTE — Progress Notes (Signed)
   Established Patient Office Visit   Subjective:  Patient ID: Debra Khan, female    DOB: 04-Sep-1950  Age: 73 y.o. MRN: [id number]  Chief Complaint  Patient presents with   Night Sweats    HPI Patient is complaining of night sweats. She reports they started years ago when going through menopause. Some nights is worse than other nights. Intensity has not changed. Previously been on estrogen years ago with improvement. Denies fatigue, CP, SHOB, HA, N/V/D or constipation.   Also, concerned about previous lab results from June. She has a history of elevated WBC, elevated glucose low calcium  levels. Last labs were drawn in June.  ROS See HPI above     Objective:   BP 128/76   Pulse 76   Temp 98.1 F (36.7 C) (Oral)   Ht 5' 4 (1.626 m)   Wt 136 lb (61.7 kg)   SpO2 97%   BMI 23.34 kg/m    Physical Exam Vitals reviewed.  Constitutional:      General: She is not in acute distress.    Appearance: Normal appearance. She is not ill-appearing, toxic-appearing or diaphoretic.  HENT:     Head: Normocephalic and atraumatic.  Eyes:     General:        Right eye: No discharge.        Left eye: No discharge.     Conjunctiva/sclera: Conjunctivae normal.  Cardiovascular:     Rate and Rhythm: Normal rate and regular rhythm.     Heart sounds: Normal heart sounds. No murmur heard.    No friction rub. No gallop.  Pulmonary:     Effort: Pulmonary effort is normal. No respiratory distress.     Breath sounds: Normal breath sounds.  Musculoskeletal:        General: Normal range of motion.  Skin:    General: Skin is warm and dry.  Neurological:     General: No focal deficit present.     Mental Status: She is alert and oriented to person, place, and time. Mental status is at baseline.  Psychiatric:        Mood and Affect: Mood normal.        Behavior: Behavior normal.        Thought Content: Thought content normal.        Judgment: Judgment normal.      Assessment & Plan:   Chronic night sweats -     CBC with Differential/Platelet -     Comprehensive metabolic panel with GFR -     TSH -     Venlafaxine  HCl ER; Take 1 capsule (37.5 mg total) by mouth daily with breakfast.  Dispense: 30 capsule; Refill: 1  Leukocytosis, unspecified type -     CBC with Differential/Platelet  Elevated glucose level -     Comprehensive metabolic panel with GFR -     Hemoglobin A1c  Hypocalcemia -     Comprehensive metabolic panel with GFR  -Ordered CBC, CMP, TSH, and A1c based on complaints of chronic night sweats, previous abnormal white blood count, elevated glucose level, and low calcium  level. Office will call with lab results and will be available via MyChart.  -Prescribed Venlafaxine  ER 37.5 mg daily to see if this helps with nigh sweats.   Return in about 1 month (around 01/14/2024) for follow-up.   Wilna Pennie, NP

## 2023-12-15 NOTE — Patient Instructions (Addendum)
-  It was great to see you today. -Ordered CBC, CMP, TSH, and A1c based on complaints of chronic night sweats, previous abnormal white blood count, elevated glucose level, and low calcium  level. Office will call with lab results and will be available via MyChart.  -Prescribed Venlafaxine  ER 37.5 mg daily to see if this helps with nigh sweats.  -Follow up in 1 month.

## 2023-12-16 ENCOUNTER — Ambulatory Visit

## 2023-12-16 ENCOUNTER — Ambulatory Visit: Payer: Self-pay | Admitting: Family Medicine

## 2023-12-20 LAB — PTH, INTACT AND CALCIUM: Calcium: 10.5 mg/dL — ABNORMAL HIGH (ref 8.6–10.4)

## 2023-12-21 ENCOUNTER — Ambulatory Visit: Payer: Self-pay | Admitting: Family Medicine

## 2023-12-22 ENCOUNTER — Other Ambulatory Visit

## 2023-12-23 ENCOUNTER — Ambulatory Visit: Payer: Self-pay | Admitting: Family Medicine

## 2023-12-23 LAB — PTH, INTACT AND CALCIUM
Calcium: 8.4 mg/dL — ABNORMAL LOW (ref 8.6–10.4)
PTH: 26 pg/mL (ref 16–77)

## 2023-12-30 ENCOUNTER — Encounter (INDEPENDENT_AMBULATORY_CARE_PROVIDER_SITE_OTHER): Payer: Self-pay | Admitting: Gastroenterology

## 2024-01-03 ENCOUNTER — Encounter: Admitting: Family Medicine

## 2024-01-04 ENCOUNTER — Encounter (INDEPENDENT_AMBULATORY_CARE_PROVIDER_SITE_OTHER): Payer: Self-pay | Admitting: Gastroenterology

## 2024-01-16 ENCOUNTER — Ambulatory Visit (INDEPENDENT_AMBULATORY_CARE_PROVIDER_SITE_OTHER): Admitting: Family Medicine

## 2024-01-16 ENCOUNTER — Encounter: Payer: Self-pay | Admitting: Family Medicine

## 2024-01-16 VITALS — BP 116/68 | HR 65 | Temp 98.0°F | Ht 64.0 in | Wt 131.0 lb

## 2024-01-16 DIAGNOSIS — R61 Generalized hyperhidrosis: Secondary | ICD-10-CM

## 2024-01-16 DIAGNOSIS — R748 Abnormal levels of other serum enzymes: Secondary | ICD-10-CM | POA: Diagnosis not present

## 2024-01-16 LAB — COMPREHENSIVE METABOLIC PANEL WITH GFR
ALT: 27 U/L (ref 0–35)
AST: 22 U/L (ref 0–37)
Albumin: 4.9 g/dL (ref 3.5–5.2)
Alkaline Phosphatase: 66 U/L (ref 39–117)
BUN: 17 mg/dL (ref 6–23)
CO2: 30 meq/L (ref 19–32)
Calcium: 10.6 mg/dL — ABNORMAL HIGH (ref 8.4–10.5)
Chloride: 104 meq/L (ref 96–112)
Creatinine, Ser: 0.82 mg/dL (ref 0.40–1.20)
GFR: 71.36 mL/min (ref >60.00)
Glucose, Bld: 101 mg/dL — ABNORMAL HIGH (ref 70–99)
Potassium: 5.2 meq/L — ABNORMAL HIGH (ref 3.5–5.1)
Sodium: 140 meq/L (ref 135–145)
Total Bilirubin: 0.6 mg/dL (ref 0.2–1.2)
Total Protein: 7.6 g/dL (ref 6.0–8.3)

## 2024-01-16 MED ORDER — VENLAFAXINE HCL ER 37.5 MG PO CP24: 37.5000 mg | ORAL_CAPSULE | Freq: Every day | ORAL | 1 refills | Status: AC

## 2024-01-16 NOTE — Progress Notes (Unsigned)
   Established Patient Office Visit   Subjective:  Patient ID: Debra Khan, female    DOB: [birth year]  Age: 73 y.o. MRN: [id number]  Chief Complaint  Patient presents with  . Medical Management of Chronic Issues    1 month follow up     HPI Patient is here for a one month follow up   ROS See HPI above     Objective:   BP 116/68   Pulse 65   Temp 98 F (36.7 C) (Oral)   Ht 5' 4 (1.626 m)   Wt 131 lb (59.4 kg)   SpO2 98%   BMI 22.49 kg/m  Wt Readings from Last 3 Encounters:  01/16/24 131 lb (59.4 kg)  12/15/23 136 lb (61.7 kg)  10/10/23 135 lb 11.2 oz (61.6 kg)      Physical Exam  No results found for any visits on 01/16/24.  The 10-year ASCVD risk score (Arnett DK, et al., 2019) is: 9.6%    Assessment & Plan:  Hypercalcemia -     Comprehensive metabolic panel with GFR  Elevated liver enzymes -     Comprehensive metabolic panel with GFR  Chronic night sweats -     Venlafaxine  HCl ER; Take 1 capsule (37.5 mg total) by mouth daily with breakfast.  Dispense: 90 capsule; Refill: 1    Return in about 6 months (around 07/16/2024) for chronic management.   Darick Fetters, NP

## 2024-01-17 ENCOUNTER — Ambulatory Visit: Payer: Self-pay | Admitting: Family Medicine

## 2024-01-17 DIAGNOSIS — R61 Generalized hyperhidrosis: Secondary | ICD-10-CM | POA: Insufficient documentation

## 2024-01-17 DIAGNOSIS — E875 Hyperkalemia: Secondary | ICD-10-CM

## 2024-01-17 NOTE — Patient Instructions (Addendum)
-  It was good to see you today! Keep up the good work with your diet and exercise!  -Continue Venlafaxine  for chronic night sweats. Refilled medication. -Ordered lab (CMP) to assess elevated liver enzymes and calcium  level that was abnormal at previous visit.  -Follow up in 6 months.

## 2024-01-17 NOTE — Assessment & Plan Note (Signed)
-  Continue Venlafaxine  for chronic night sweats since it has improved her symptoms. Refilled medication.

## 2024-01-30 ENCOUNTER — Other Ambulatory Visit

## 2024-02-21 DIAGNOSIS — D485 Neoplasm of uncertain behavior of skin: Secondary | ICD-10-CM | POA: Diagnosis not present

## 2024-02-21 DIAGNOSIS — C44111 Basal cell carcinoma of skin of unspecified eyelid, including canthus: Secondary | ICD-10-CM | POA: Diagnosis not present

## 2024-02-21 DIAGNOSIS — L719 Rosacea, unspecified: Secondary | ICD-10-CM | POA: Diagnosis not present

## 2024-03-08 DIAGNOSIS — C44319 Basal cell carcinoma of skin of other parts of face: Secondary | ICD-10-CM | POA: Diagnosis not present

## 2024-07-16 ENCOUNTER — Ambulatory Visit: Admitting: Family Medicine
